# Patient Record
Sex: Female | Born: 1965 | Race: White | Hispanic: No | Marital: Married | Smoking: Never smoker
Health system: Southern US, Community
[De-identification: ages and names within clinical notes are randomized; demographics above are authoritative.]

## PROBLEM LIST (undated history)

## (undated) DIAGNOSIS — J45909 Unspecified asthma, uncomplicated: Secondary | ICD-10-CM

## (undated) HISTORY — DX: Unspecified asthma, uncomplicated: J45.909

## (undated) HISTORY — PX: HEMORRHOID SURGERY: SHX153

## (undated) HISTORY — PX: CHOLECYSTECTOMY: SHX55

---

## 1998-05-31 ENCOUNTER — Other Ambulatory Visit: Admission: RE | Admit: 1998-05-31 | Discharge: 1998-05-31 | Payer: Self-pay | Admitting: Obstetrics and Gynecology

## 1999-03-29 ENCOUNTER — Other Ambulatory Visit: Admission: RE | Admit: 1999-03-29 | Discharge: 1999-03-29 | Payer: Self-pay | Admitting: Obstetrics and Gynecology

## 1999-11-09 ENCOUNTER — Encounter: Payer: Self-pay | Admitting: Obstetrics and Gynecology

## 1999-11-09 ENCOUNTER — Ambulatory Visit (HOSPITAL_COMMUNITY): Admission: RE | Admit: 1999-11-09 | Discharge: 1999-11-09 | Payer: Self-pay | Admitting: Obstetrics and Gynecology

## 2000-04-15 ENCOUNTER — Other Ambulatory Visit: Admission: RE | Admit: 2000-04-15 | Discharge: 2000-04-15 | Payer: Self-pay | Admitting: Gynecology

## 2001-10-20 ENCOUNTER — Other Ambulatory Visit: Admission: RE | Admit: 2001-10-20 | Discharge: 2001-10-20 | Payer: Self-pay | Admitting: Obstetrics and Gynecology

## 2002-04-28 ENCOUNTER — Inpatient Hospital Stay (HOSPITAL_COMMUNITY): Admission: AD | Admit: 2002-04-28 | Discharge: 2002-05-01 | Payer: Self-pay | Admitting: Obstetrics and Gynecology

## 2002-05-02 ENCOUNTER — Encounter: Admission: RE | Admit: 2002-05-02 | Discharge: 2002-06-01 | Payer: Self-pay | Admitting: Obstetrics and Gynecology

## 2002-05-05 ENCOUNTER — Inpatient Hospital Stay (HOSPITAL_COMMUNITY): Admission: AD | Admit: 2002-05-05 | Discharge: 2002-05-05 | Payer: Self-pay | Admitting: Obstetrics and Gynecology

## 2002-05-05 ENCOUNTER — Encounter: Payer: Self-pay | Admitting: Obstetrics and Gynecology

## 2002-06-04 ENCOUNTER — Other Ambulatory Visit: Admission: RE | Admit: 2002-06-04 | Discharge: 2002-06-04 | Payer: Self-pay | Admitting: Obstetrics and Gynecology

## 2003-01-13 ENCOUNTER — Emergency Department (HOSPITAL_COMMUNITY): Admission: EM | Admit: 2003-01-13 | Discharge: 2003-01-14 | Payer: Self-pay | Admitting: *Deleted

## 2003-01-14 ENCOUNTER — Inpatient Hospital Stay (HOSPITAL_COMMUNITY): Admission: AD | Admit: 2003-01-14 | Discharge: 2003-01-17 | Payer: Self-pay | Admitting: General Surgery

## 2003-01-14 ENCOUNTER — Ambulatory Visit (HOSPITAL_COMMUNITY): Admission: RE | Admit: 2003-01-14 | Discharge: 2003-01-14 | Payer: Self-pay | Admitting: *Deleted

## 2003-01-16 ENCOUNTER — Encounter (INDEPENDENT_AMBULATORY_CARE_PROVIDER_SITE_OTHER): Payer: Self-pay | Admitting: *Deleted

## 2003-05-14 ENCOUNTER — Other Ambulatory Visit: Admission: RE | Admit: 2003-05-14 | Discharge: 2003-05-14 | Payer: Self-pay | Admitting: Gynecology

## 2004-05-16 ENCOUNTER — Other Ambulatory Visit: Admission: RE | Admit: 2004-05-16 | Discharge: 2004-05-16 | Payer: Self-pay | Admitting: Gynecology

## 2004-06-06 ENCOUNTER — Encounter: Admission: RE | Admit: 2004-06-06 | Discharge: 2004-06-06 | Payer: Self-pay | Admitting: Gynecology

## 2004-10-20 ENCOUNTER — Other Ambulatory Visit: Admission: RE | Admit: 2004-10-20 | Discharge: 2004-10-20 | Payer: Self-pay | Admitting: Obstetrics and Gynecology

## 2004-12-13 ENCOUNTER — Inpatient Hospital Stay (HOSPITAL_COMMUNITY): Admission: AD | Admit: 2004-12-13 | Discharge: 2004-12-14 | Payer: Self-pay | Admitting: Obstetrics and Gynecology

## 2005-03-28 ENCOUNTER — Encounter: Admission: RE | Admit: 2005-03-28 | Discharge: 2005-06-26 | Payer: Self-pay | Admitting: Obstetrics and Gynecology

## 2005-04-23 ENCOUNTER — Inpatient Hospital Stay (HOSPITAL_COMMUNITY): Admission: AD | Admit: 2005-04-23 | Discharge: 2005-04-26 | Payer: Self-pay | Admitting: Obstetrics and Gynecology

## 2005-04-23 ENCOUNTER — Encounter (INDEPENDENT_AMBULATORY_CARE_PROVIDER_SITE_OTHER): Payer: Self-pay | Admitting: Specialist

## 2005-06-04 ENCOUNTER — Other Ambulatory Visit: Admission: RE | Admit: 2005-06-04 | Discharge: 2005-06-04 | Payer: Self-pay | Admitting: Obstetrics and Gynecology

## 2006-04-18 ENCOUNTER — Encounter: Admission: RE | Admit: 2006-04-18 | Discharge: 2006-04-18 | Payer: Self-pay | Admitting: General Surgery

## 2006-04-18 ENCOUNTER — Encounter: Payer: Self-pay | Admitting: Critical Care Medicine

## 2006-07-15 ENCOUNTER — Ambulatory Visit: Payer: Self-pay | Admitting: Critical Care Medicine

## 2006-07-23 ENCOUNTER — Encounter: Payer: Self-pay | Admitting: Critical Care Medicine

## 2006-08-26 ENCOUNTER — Ambulatory Visit: Payer: Self-pay | Admitting: Critical Care Medicine

## 2007-04-14 ENCOUNTER — Encounter: Admission: RE | Admit: 2007-04-14 | Discharge: 2007-04-14 | Payer: Self-pay | Admitting: Obstetrics and Gynecology

## 2007-10-01 DIAGNOSIS — J45909 Unspecified asthma, uncomplicated: Secondary | ICD-10-CM | POA: Insufficient documentation

## 2007-10-02 ENCOUNTER — Ambulatory Visit: Payer: Self-pay | Admitting: Critical Care Medicine

## 2007-10-02 DIAGNOSIS — R0602 Shortness of breath: Secondary | ICD-10-CM | POA: Insufficient documentation

## 2007-10-15 ENCOUNTER — Ambulatory Visit (HOSPITAL_COMMUNITY): Admission: RE | Admit: 2007-10-15 | Discharge: 2007-10-15 | Payer: Self-pay | Admitting: Internal Medicine

## 2007-10-15 ENCOUNTER — Encounter: Payer: Self-pay | Admitting: Critical Care Medicine

## 2007-10-22 ENCOUNTER — Ambulatory Visit: Payer: Self-pay | Admitting: Critical Care Medicine

## 2007-10-23 ENCOUNTER — Encounter: Payer: Self-pay | Admitting: Critical Care Medicine

## 2007-11-03 ENCOUNTER — Telehealth: Payer: Self-pay | Admitting: Critical Care Medicine

## 2007-11-06 ENCOUNTER — Telehealth (INDEPENDENT_AMBULATORY_CARE_PROVIDER_SITE_OTHER): Payer: Self-pay | Admitting: *Deleted

## 2007-11-06 ENCOUNTER — Telehealth: Payer: Self-pay | Admitting: Critical Care Medicine

## 2007-11-10 ENCOUNTER — Ambulatory Visit: Payer: Self-pay | Admitting: Internal Medicine

## 2007-11-24 ENCOUNTER — Ambulatory Visit: Payer: Self-pay | Admitting: Critical Care Medicine

## 2008-01-19 ENCOUNTER — Telehealth: Payer: Self-pay | Admitting: Critical Care Medicine

## 2008-02-10 ENCOUNTER — Ambulatory Visit: Payer: Self-pay | Admitting: Internal Medicine

## 2008-04-06 ENCOUNTER — Ambulatory Visit: Payer: Self-pay | Admitting: Internal Medicine

## 2008-04-28 ENCOUNTER — Telehealth (INDEPENDENT_AMBULATORY_CARE_PROVIDER_SITE_OTHER): Payer: Self-pay | Admitting: *Deleted

## 2008-05-10 ENCOUNTER — Encounter: Admission: RE | Admit: 2008-05-10 | Discharge: 2008-05-10 | Payer: Self-pay | Admitting: Obstetrics and Gynecology

## 2008-05-20 ENCOUNTER — Ambulatory Visit: Payer: Self-pay | Admitting: Internal Medicine

## 2009-05-05 ENCOUNTER — Ambulatory Visit: Payer: Self-pay | Admitting: Vascular Surgery

## 2009-05-11 ENCOUNTER — Encounter: Admission: RE | Admit: 2009-05-11 | Discharge: 2009-05-11 | Payer: Self-pay | Admitting: Obstetrics and Gynecology

## 2009-08-02 IMAGING — MG MM SCREENING W/IMPLANTS
8 series · 8 of 8 positions shown · non-contrast
Comparison: Prior studies.

DG SCREENING W/IMPLANTS
Bilateral CC and MLO view(s) were taken.
Prior study comparison: April 14, 2007, bilateral DG screening w/implants.

DIGITAL SCREENING MAMMOGRAM W/IMPLANTS WITH CAD:

[R CC]
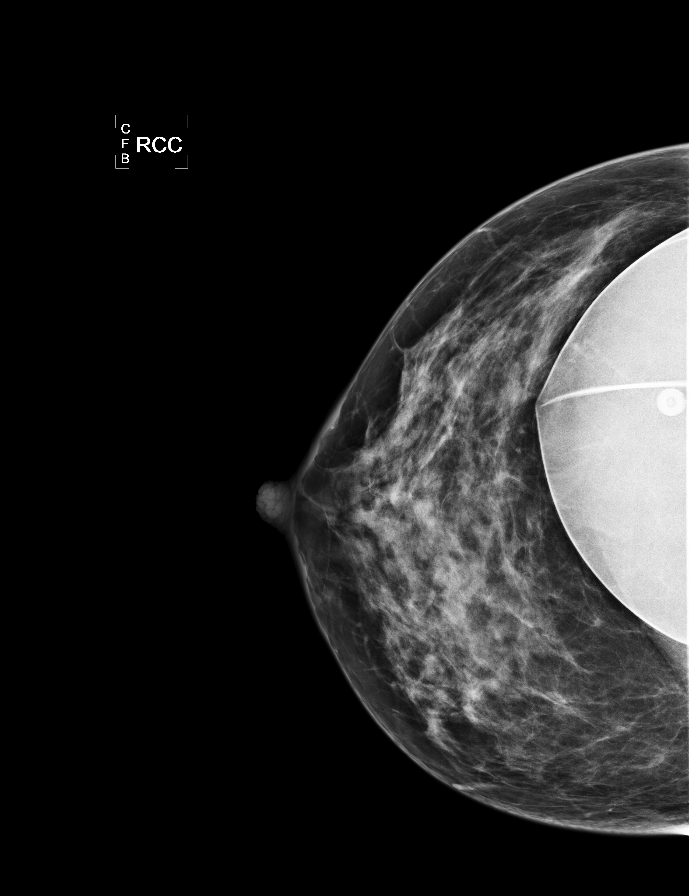

[L CC]
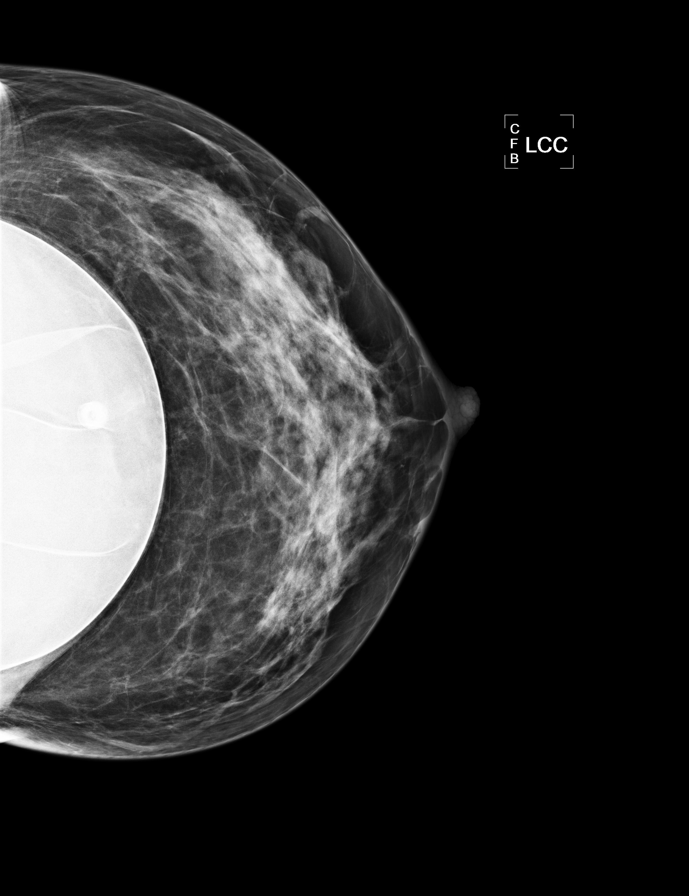

[L MLO]
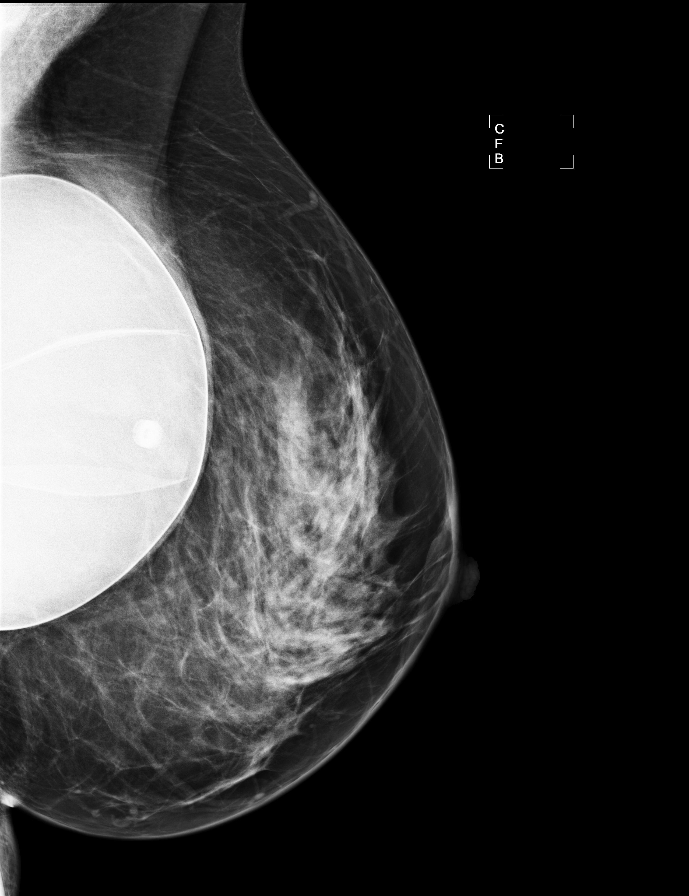

[R MLO]
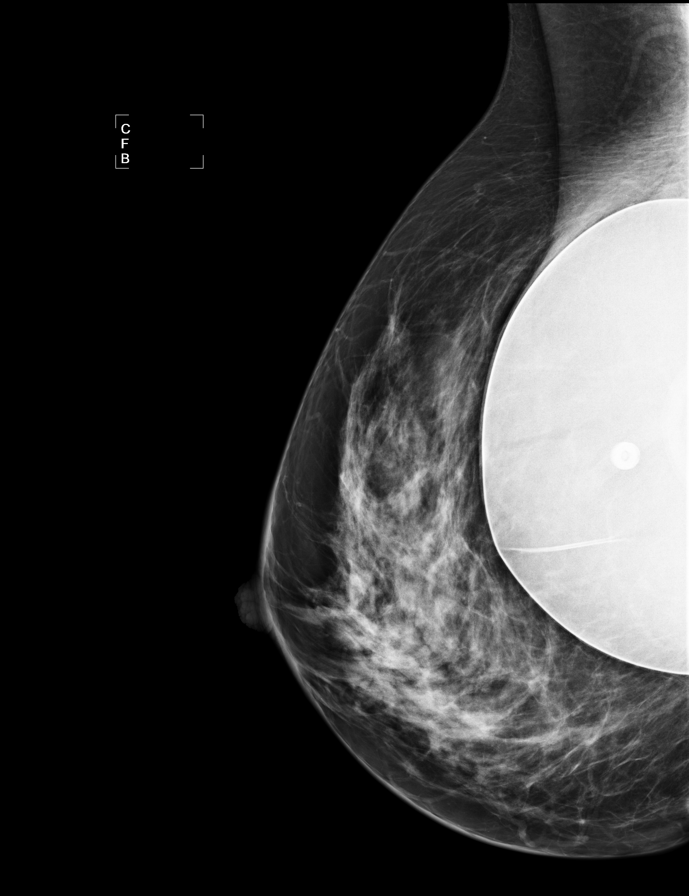

[R CCID]
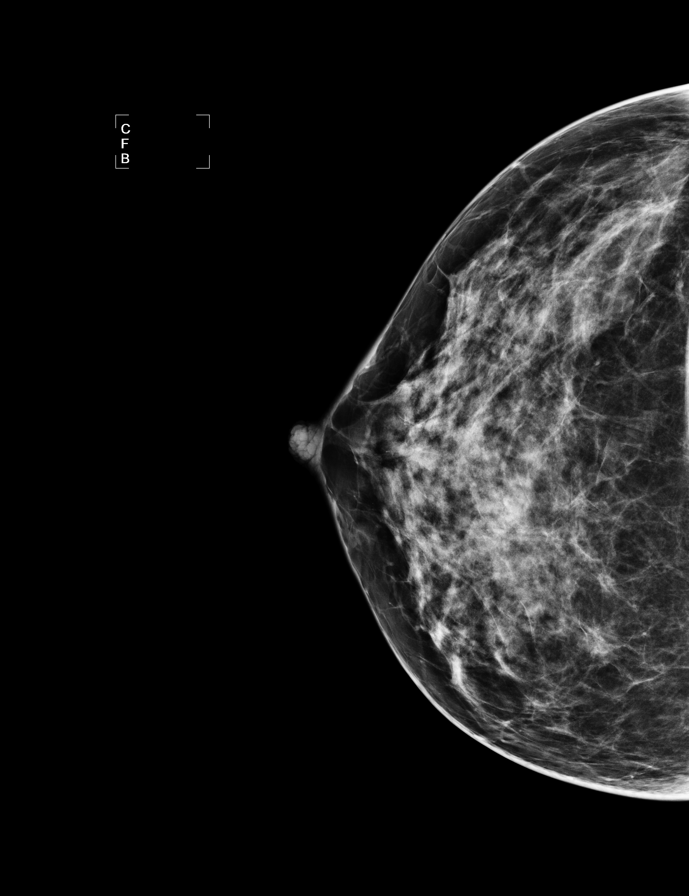

[L CCID]
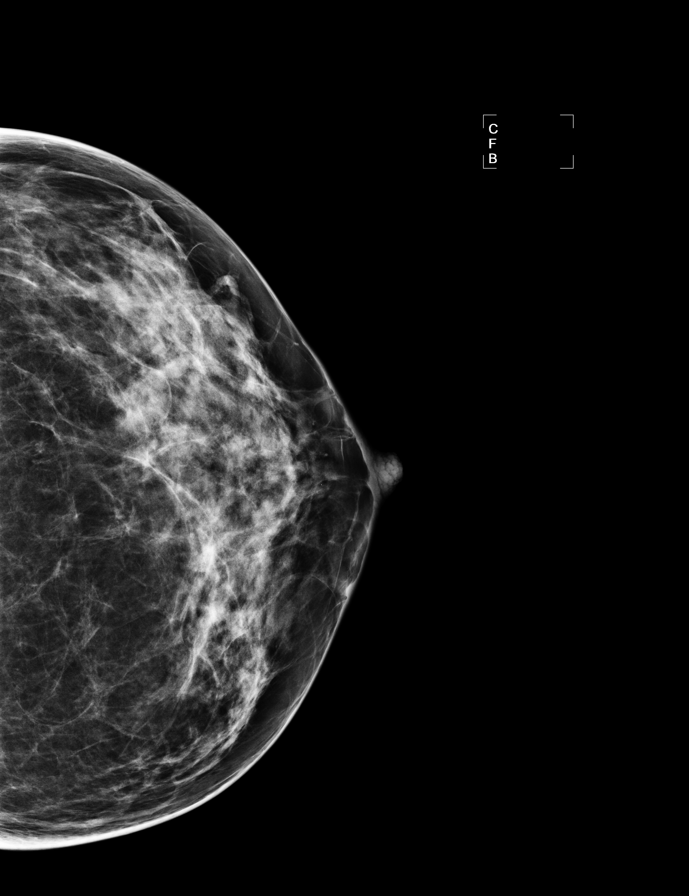

[L MLOID]
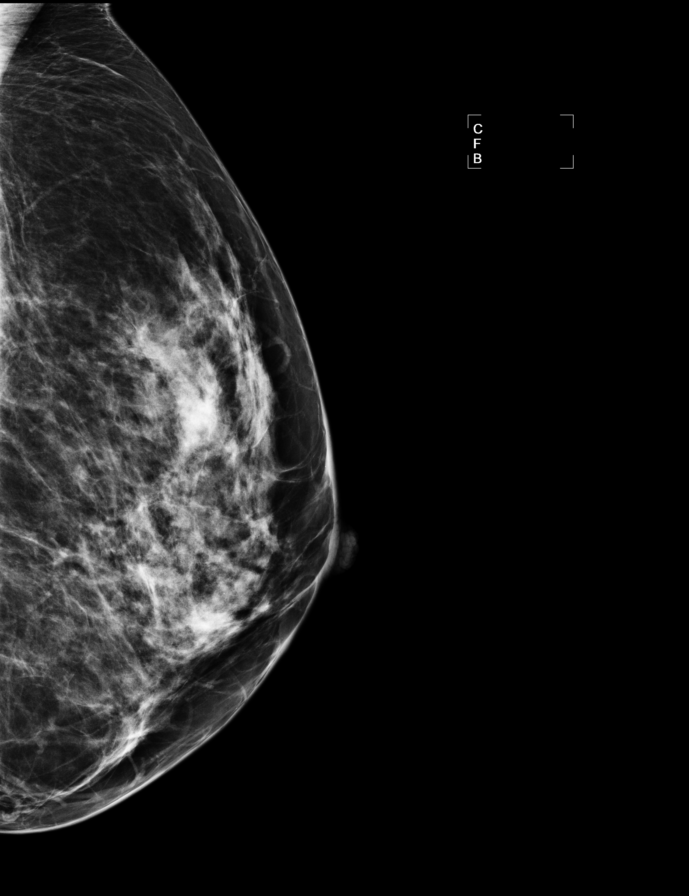

[R MLOID]
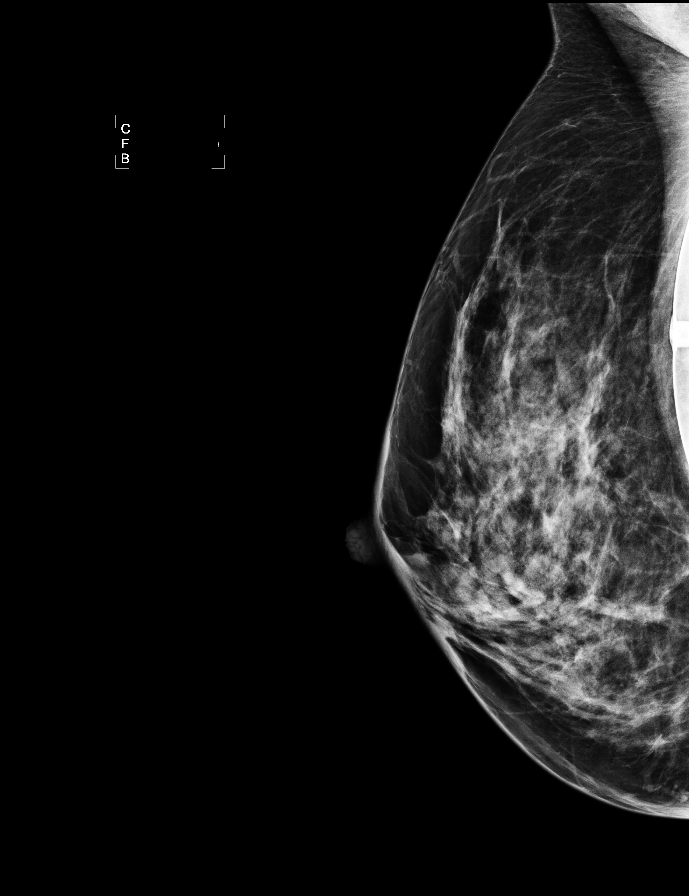

[8 of 8 positions shown; findings below may reference images not displayed]

There are subpectoral saline implants.  Implant included and implant displaced views are obtained.

The breast tissue is heterogeneously dense.  There is no dominant mass, architectural distortion or
calcification to suggest malignancy.
IMPRESSION: No mammographic evidence of malignancy.  Suggest yearly screening mammography.

A result letter of this screening mammogram will be mailed directly to the patient.

ASSESSMENT: Negative - BI-RADS 1

Screening mammogram of both breasts in 1 year.
ANALYZED BY COMPUTER AIDED DETECTION. , THIS PROCEDURE WAS A DIGITAL MAMMOGRAM.

## 2009-10-10 ENCOUNTER — Telehealth (INDEPENDENT_AMBULATORY_CARE_PROVIDER_SITE_OTHER): Payer: Self-pay | Admitting: *Deleted

## 2010-04-01 ENCOUNTER — Encounter: Payer: Self-pay | Admitting: *Deleted

## 2010-04-04 ENCOUNTER — Ambulatory Visit
Admission: RE | Admit: 2010-04-04 | Discharge: 2010-04-04 | Payer: Self-pay | Source: Home / Self Care | Attending: Critical Care Medicine | Admitting: Critical Care Medicine

## 2010-04-04 ENCOUNTER — Encounter: Payer: Self-pay | Admitting: Critical Care Medicine

## 2010-04-11 NOTE — Miscellaneous (Signed)
Summary: pft  Clinical Lists Changes  Observations: Added new observation of PFT COMMENTS: Completely normal PFT (10/23/2007 9:21) Added new observation of PFT RSLT: normal (10/23/2007 9:21) Added new observation of DLCO/VA%EXP: 100 % (10/23/2007 9:21) Added new observation of DLCO/VA: 4.23 % (10/23/2007 9:21) Added new observation of DLCO % EXPEC: 86 % (10/23/2007 9:21) Added new observation of DLCO: 17.0 % (10/23/2007 9:21) Added new observation of RV % EXPECT: 101 % (10/23/2007 9:21) Added new observation of RV: 1.52 L (10/23/2007 9:21) Added new observation of TLC % EXPECT: 118 % (10/23/2007 9:21) Added new observation of TLC: 5.47 L (10/23/2007 9:21) Added new observation of FEF2575%EXPS: 111 % (10/23/2007 9:21) Added new observation of PSTFEF25/75P: 3.07  (10/23/2007 9:21) Added new observation of PSTFEF25/75%: 3.41 L/min (10/23/2007 9:21) Added new observation of FEV1FVCPRDPS: 77 % (10/23/2007 9:21) Added new observation of PSTFEV1/FVC: 84 % (10/23/2007 9:21) Added new observation of POSTFEV1%PRD: 128 % (10/23/2007 9:21) Added new observation of FEV1PRDPST: 2.59 L (10/23/2007 9:21) Added new observation of POST FEV1: 3.30 L/min (10/23/2007 9:21) Added new observation of POST FVC%EXP: 118 % (10/23/2007 9:21) Added new observation of FVCPRDPST: 3.32 L/min (10/23/2007 9:21) Added new observation of POST FVC: 3.91 L (10/23/2007 9:21) Added new observation of FEF % EXPEC: 110 % (10/23/2007 9:21) Added new observation of FEF25-75%PRE: 3.07 L/min (10/23/2007 9:21) Added new observation of FEF 25-75%: 3.39 L/min (10/23/2007 9:21) Added new observation of FEV1/FVC PRE: 77 % (10/23/2007 9:21) Added new observation of FEV1/FVC: 82 % (10/23/2007 9:21) Added new observation of FEV1 % EXP: 125 % (10/23/2007 9:21) Added new observation of FEV1 PREDICT: 2.59 L (10/23/2007 9:21) Added new observation of FEV1: 3.24 L (10/23/2007 9:21) Added new observation of FVC % EXPECT: 119 % (10/23/2007  9:21) Added new observation of FVC PREDICT: 3.32 L (10/23/2007 9:21) Added new observation of FVC: 3.94 L (10/23/2007 9:21) Added new observation of HEIGHT: 62 in (10/23/2007 9:21) Added new observation of PFT DATE: 10/22/2007  (10/23/2007 9:21)  Pulmonary Function Test Date: 10/22/2007 Height (in.): 62 Gender: Female  Pre-Spirometry FVC    Value: 3.94 L/min   Pred: 3.32 L/min     % Pred: 119 % FEV1    Value: 3.24 L     Pred: 2.59 L     % Pred: 125 % FEV1/FVC  Value: 82 %     Pred: 77 %    FEF 25-75  Value: 3.39 L/min   Pred: 3.07 L/min     % Pred: 110 %  Post-Spirometry FVC    Value: 3.91 L/min   Pred: 3.32 L/min     % Pred: 118 % FEV1    Value: 3.30 L     Pred: 2.59 L     % Pred: 128 % FEV1/FVC  Value: 84 %     Pred: 77 %    FEF 25-75  Value: 3.41 L/min   Pred: 3.07 L/min     % Pred: 111 %  Lung Volumes TLC    Value: 5.47 L   % Pred: 118 % RV    Value: 1.52 L   % Pred: 101 % DLCO    Value: 17.0 %   % Pred: 86 % DLCO/VA  Value: 4.23 %   % Pred: 100 %  Comments: Completely normal PFT  Evaluation: normal Michel Bickers [facility]  October 23, 2007 9:31 AM   Appended Document: pft result noted  patient aware

## 2010-04-11 NOTE — Letter (Signed)
Summary: CPST [facility]  [address]   [place], [place] [address]   Phone: [phone]  Fax: [phone]     Patient's Name: [name] Date of Birth: [birth year] MRN: [id number]  CPST  Choose test method and choice  a)___Bike - recommended by ATS/ACCP. Do at [facility] at Dr[facility] G[facility]  b)___Treadmill - less preferred. Do at [facility] at Dr. Gala Romney lab or do at [facility] PFT lab  Choose one or more indication for test  INDICATIONS FOR CARDIOPULMONARY EXERCISE TESTING Evaluation of exercise tolerance ______ Determination of functional impairment or capacity (peak V? O2) ______ Determination of exercise-limiting factors and pathophysiologic mechanisms  Evaluation of undiagnosed exercise intolerance _____ Assessing contribution of cardiac and pulmonary etiology in coexisting disease _____ Symptoms disproportionate to resting pulmonary and cardiac tests  _____Unexplained dyspnea when initial cardiopulmonary testing is nondiagnostic  Evaluation of patients with cardiovascular disease _____ Functional evaluation and prognosis in patients with heart failure _____ Selection for cardiac transplantation _____ Exercise prescription and monitoring response to exercise training for cardiac rehabilitation (special circumstances; i.e., pacemakers)  Evaluation of patients with respiratory disease _____ Functional impairment assessment (see specific clinical applications)  _____Chronic obstructive pulmonary disease Establishing exercise limitation(s) and assessing other potential contributing factors, especially occult heart disease (ischemia) ______Determination of magnitude of hypoxemia and for O2 prescription When objective determination of therapeutic intervention is necessary and not adequately addressed by standard pulmonary function testing  _____ Interstitial lung diseases _____Detection of early (occult) gas exchange abnormalities _____Overall  assessment/monitoring of pulmonary gas exchange _____Determination of magnitude of hypoxemia and for O2 prescription _____Determination of potential exercise-limiting factors _____Documentation of therapeutic response to potentially toxic therapy  ____ Pulmonary vascular disease (careful risk-benefit analysis required)  ____ Cystic fibrosis  ____ Exercise-induced bronchospasm  Specific clinical applications ____  Preoperative evaluation _____Lung resectional surgery _____Elderly patients undergoing major abdominal surgery _____Lung volume resectional surgery for emphysema (currently investigational)  ____ Exercise evaluation and prescription for pulmonary rehabilitation  ____ Evaluation for impairment-disability  ____ Evaluation for lung, heart-lung transplantation ____ Definition of abbreviation: V? O2______ -oxygen consumption.    Alfonso Ramus    [facility]

## 2010-04-11 NOTE — Assessment & Plan Note (Signed)
Summary: Pulmonary OV   PCP:  Grewal  Chief Complaint:  Follow-up CPST and PFT.  Pharmacy wanting to change pt's Allegra D 12H to Allegtra 180mg . See form.Marland Kitchen  History of Present Illness: Pulmonary OV F/U:  [name] is a 45 year old female with history of moderate persistent asthma, EIA and atopic features. Pos Cpst 7/09 for EIA  7/23:   Since the last office visit the patient has stopped taking Qvar.  She is not using her rescue inhaler regularly.  Her cough is now resolved.  She is still short of breath with exertion.  Some days are worse than others.  She is able to take care of her small children.  But occasionally when she sits at a desk,  she becomes short of breath.  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  9/14:  CPST performed:  positive for airflow obstruction with exercise.  Pt now on qvar.  Notes less dyspnea.  There is no cough.  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  Will provide MSD sheets.  9/14: ? sl better.   on qvar.  runs with 44yo.  still sob even if not physically active. no cough.  not use of ventolin hfa.   no cp.   no wheeze.   no mucous.   had an infection and took voice away three weeks ago.  no wheeze. never had wheeze.    no nocturnal sxs.      Prior Medications Reviewed Using: Patient Recall  Prior Medication List:  QVAR 40 MCG/ACT  AERS (BECLOMETHASONE DIPROPIONATE) Inhale 2 puffs two times a day VENTOLIN HFA 108 (90 BASE) MCG/ACT  AERS (ALBUTEROL SULFATE) prn ALLEGRA-D 12 HOUR 60-120 MG XR12H-TAB (FEXOFENADINE-PSEUDOEPHEDRINE) Take 1 tablet by mouth two times a day as needed   Current Allergies (reviewed today): ! SULFA  Past Medical History:    Reviewed history from 10/02/2007 and no changes required:       Asthma  6/08: normal spirometry; positive CPST for EIA 09/2007     Review of Systems      See HPI   Vital Signs:  Patient  Profile:   45 Years Old Female Height:     62 inches Weight:      122 pounds O2 Sat:      98 % O2 treatment:    Room Air Temp:     98.1 degrees F oral Pulse rate:   69 / minute BP sitting:   106 / 68  (left arm) Cuff size:   regular  Vitals Entered By: Michel Bickers CMA (November 24, 2007 12:10 PM)                 Physical Exam  Gen: WD WN     WF     in NAD    NCAT Heent:  no jvd, no TMG, no cervical LNademopathy, orophyx clear,  nares with clear watery drainage. Cor: RRR nl s1/s2  no s3/s4  no m r h g Abd: soft NT BSA   no masses  No HSM  no rebound or guarding Ext perfused with no c v e v.d Neuro: intact, moves all 4s, CN II-XII intact, DTRs intact Chest:   no wheezes, rales, rhonchi   no egophony  no consolidative breath sounds Skin: clear  Genital/Rectal :deferred       Impression & Recommendations:  Problem #  1:  ASTHMA (ICD-493.90) Assessment: Unchanged EIA with pos CPST; atopic features plan: qvar two times a day dosing, adjust to three puff two times a day for one week then two puff two times a day  40mg  stn. allergy eval   Medications Added to Medication List This Visit: 1)  Allegra 180 Mg Tabs (Fexofenadine hcl) .... By mouth daily  Complete Medication List: 1)  Qvar 40 Mcg/act Aers (Beclomethasone dipropionate) .... Inhale 2 puffs two times a day 2)  Ventolin Hfa 108 (90 Base) Mcg/act Aers (Albuterol sulfate) .... Prn 3)  Allegra 180 Mg Tabs (Fexofenadine hcl) .... By mouth daily   Patient Instructions: 1)  Allergy  evaluation with dr Maple Hudson 2)  Return in two months 3)  increase Qvar to three puffs twice a day for one week then reduce to two puffs twice a day   Prescriptions: ALLEGRA 180 MG  TABS (FEXOFENADINE HCL) By mouth daily  #30 x 6   Entered and Authorized by:   Bethany Frisk MD   Signed by:   Bethany Frisk MD on 11/24/2007   Method used:   Electronically to        [facility]* (retail)       [address]        [place], [place]  [address]       Ph: [phone]       Fax: [phone]   RxID:   [id number]  ]

## 2010-04-11 NOTE — Assessment & Plan Note (Signed)
Summary: six min walk  Nurse Visit   Vital Signs:  Patient Profile:   45 Years Old Female Height:     62 inches Pulse rate:   78 / minute BP sitting:   110 / 68                 Prior Medications: QVAR 40 MCG/ACT  AERS (BECLOMETHASONE DIPROPIONATE) Inhale 2 puffs two times a day VENTOLIN HFA 108 (90 BASE) MCG/ACT  AERS (ALBUTEROL SULFATE) prn ALLEGRA 180 MG  TABS (FEXOFENADINE HCL) By mouth daily Current Allergies: ! SULFA    Orders Added: 1)  Pulmonary Stress (6 min walk) [place]    ]  Six Minute Walk Test Medications taken before test(dose and time): none Supplemental oxygen during the test: No  Lap counter(place a tick mark inside a square for each lap completed) lap 1 complete  lap 2 complete   lap 3 complete   lap 4 complete  lap 5 complete  lap 6 complete  lap 7 complete   lap 8 complete   lap 9 complete  lap 10 complete  lap 11 complete    Baseline  BP sitting: 110/ 68 Heart rate: 78 Dyspnea ( Borg scale) 0 Fatigue (Borg scale) 1 SPO2 98  End Of Test  BP sitting: 112/ 70 Heart rate: 87 Dyspnea ( Borg scale) 2 Fatigue (Borg scale) 1 SPO2 100  2 Minutes post  BP sitting: 110/ 70 Heart rate: 78 SPO2 100  Stopped or paused before six minutes? No  Interpretation: Number of laps  11 X 48 meters =   528 meters+ final partial lap: 27 meters =    555 meters   Total distance walked in six minutes: 555 meters  Tech ID: Tivis Ringer (April 06, 2008 4:11 PM) Tech Comments pt completed test w/ 0 rest breaks and 0 complaints.

## 2010-04-11 NOTE — Assessment & Plan Note (Signed)
Summary: ALLERGY CONSULT PER PW///kp   Vital Signs:  Patient Profile:   45 Years Old Female Height:     62 inches Weight:      129 pounds O2 Sat:      100 % O2 treatment:    Room Air Pulse rate:   102 / minute BP sitting:   110 / 76  (left arm) Cuff size:   regular  Vitals Entered By: Reynaldo Minium CMA (February 10, 2008 4:23 PM)                 Referred by:  Dr. Delford Field PCP:  Vincente Poli  Chief Complaint:  allergy  consult.  History of Present Illness: 45 yoF referred in allergy consultation by Dr P.Wright, for evaluation of atopic aspects of asthma. She describes moderate persistent asthma x 2 years, with cough intermittently free up to 3-4 months at a time. Little rhinitis, itch or sneeze. Feels short of breath, and bothersome compulsion to yawn to relieve dyspnea, but little wheeze.  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  RAST 07/23/06- Neg, IgE 23.7. IgG food panel was broadly positive but she denies hx of food sensitivity. PFT 07/15/06- WNL. CPST ?'d mild exertional asthma. 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 She gets visible dust on her desk, but it doesn't limit activity and she does not feel worse at work or on work days.      Updated Prior Medication List: QVAR 40 MCG/ACT  AERS (BECLOMETHASONE DIPROPIONATE) Inhale 2 puffs two times a day VENTOLIN HFA 108 (90 BASE) MCG/ACT  AERS (ALBUTEROL SULFATE) prn ALLEGRA 180 MG  TABS (FEXOFENADINE HCL) By mouth daily  Current Allergies (reviewed today): ! SULFA  Past Medical History:    Reviewed history from 11/24/2007 and no changes required:       Asthma  6/08: normal spirometry; positive CPST for EIA 09/2007  Past Surgical History:    Reviewed history from  10/01/2007 and no changes required:       Gallbladder--2004       C-section--2004 & 2007   Family History:    Reviewed history from 10/01/2007 and no changes required:       Family History Asthma--mother  Social History:    Reviewed history from 10/01/2007 and no changes required:       Patient never smoked.        No regular exercise       Works Presenter, broadcasting.    Review of Systems      See HPI       Denies headache, sinus drainage, sneezing chest pain, n/v/d, weight loss, fever, edema.     Physical Exam  General: A/Ox3; pleasant and cooperative, NAD, SKIN: no rash, lesions NODES: no lymphadenopathy HEENT: [place]/AT, EOM- WNL, Conjuctivae- clear, PERRLA, TM-WNL, Nose- clear, Throat- clear and wnl NECK: Supple w/ fair ROM, JVD- none, normal carotid impulses w/o bruits Thyroid- normal to palpation CHEST: Clear to P&A, no wheeze, cough, dullness or accessory muscle use. HEART: RRR, no m/g/r heard ABDOMEN: Soft and nl; nml bowel sounds; no organomegaly or masses noted ZOX:WRUE, nl pulses, no edema  NEURO: Grossly intact to observation       Impression & Recommendations:  Problem # 1:  DYSPNEA (ICD-786.05) Not clear that this is an obstructive airway process. Will order more detailed PFT, including Methacholine inhalation challenge as discussed. Not clear the validity or significance of elevated food related IgGs in the absence of any relation to exposure. She will watch this issuw. We discussed options and she will return off antihistamines for skin testing, including a limited food panel, looking for tissue- fixed IgE response.   Patient Instructions: 1)  Please schedule a follow up appt for allergy skin testing 2)  - you will need to be off allergy pills, antihistamines, cold and flu meds, over the counter sleeping pills- for 3 days before skin testing 3)  Schedule PFT and methacholine challenge test with [facility]   Prescriptions: QVAR 40 MCG/ACT  AERS (BECLOMETHASONE  DIPROPIONATE) Inhale 2 puffs two times a day  #1 x 5   Entered and Authorized by:   Waymon Budge MD   Signed by:   Waymon Budge MD on 02/10/2008   Method used:   Print then Give to Patient   RxID:   [id number]  ]

## 2010-04-11 NOTE — Progress Notes (Signed)
Summary: INHALER  Phone Note Call from Patient Call back at [phone]   Caller: Patient Call For: [name] Summary of Call: WANT TO SPEAK TO NURSE ABOUT Q-VAR INHALER Initial call taken by: Rickard Patience,  January 19, 2008 10:28 AM  Follow-up for Phone Call        Called spoke with pt.  Pt states she is having a hard time breathing this am.  Onset of worsened breathing last pm.  Pt has been on Qvar, pt wants to know if you would consider changing her maintaince inhaler.  Feels breathing is not adequately controlled on Qvar.  Using incr amounts of relief inhaler continuously.  Advised PW out of office, offered appt with another MD to evaluate.  Pt declined states she will call back if breathing worsened for OV before PW returns. Follow-up by: Cloyde Reams RN,  January 19, 2008 10:46 AM  Additional Follow-up for Phone Call Additional follow up Details #1::        she needs to increase qvar to 4 puffs twice a day for one week then reduce to three puff twice a day. as well obtain and instruct for the pt an aerochamber to use with the qvar inhaler Additional Follow-up by: Storm Frisk MD,  January 19, 2008 1:18 PM    Additional Follow-up for Phone Call Additional follow up Details #2::    Called spoke with pt advised of PW's recommendation to incr Qvar to 4 puffs two times a day x 1 week then decr to 3 puffs two times a day and see if this helps better control breathing and reduces need for relief inhaler.  Advised pt of recommendation to use spacer with Qvar.  Pt's husband is a Teacher, early years/pre.  She will obtain from him.  If needs [place] to get or instruct pt will call back. Follow-up by: Cloyde Reams RN,  January 19, 2008 2:22 PM

## 2010-04-11 NOTE — Assessment & Plan Note (Signed)
Summary: Pulmonary OV   PCP:  Grewal  Chief Complaint:  Follow-up.  Pt states she has been doing well and isn't using her inhalers much at all.Marland Kitchen  History of Present Illness: Pulmonary OV F/U:  This patient has not been seen since 08/2006.  At last OV following was Hx documented:  [name] is a 45 year old female with history of moderate persistent asthma, doing better with decreased cough, decreased shortness of breath. Maintains on Qvar 40 mcg strength 2 sprays b.i.d., albuterol p.r.n.  She has not required her rescue inhaler.  She brought to my attention a CT of the chest that was done to evaluate her xiphoid process in February of 2008, it showed subpleural nodules in the left lower lobe.  She is a non smoker and on review of the nodules they are not significant.  She is not currently having any active respiratory complaints at this time.   7/23:   Since the last office visit the patient has stopped taking Qvar.  She is not using her rescue inhaler regularly.  Her cough is now resolved.  She is still short of breath with exertion.  Some days are worse than others.  She is able to take care of her small children.  But occasionally when she sits at a desk,  she becomes short of breath.  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  The patient denies any nocturnal shortness of breath, wheezing, or chest tightness.  The patient does not have a peak flow meter.  Often when she uses her rescue albuterol inhaler, it does or does not help the shortness of breath.      Prior Medications Reviewed Using: Patient Recall  Updated Prior Medication List: QVAR 40 MCG/ACT  AERS (BECLOMETHASONE DIPROPIONATE) Pt not using VENTOLIN HFA 108 (90 BASE) MCG/ACT  AERS (ALBUTEROL SULFATE) prn  Current Allergies (reviewed today): ! SULFA  Past Medical History:    Reviewed history from 10/01/2007 and no changes required:  Asthma  6/08: normal spirometry   Family History:    Reviewed history from 10/01/2007 and no changes required:       Family History Asthma--mother  Social History:    Reviewed history from 10/01/2007 and no changes required:       Patient never smoked.     Review of Systems      See HPI   Vital Signs:  Patient Profile:   45 Years Old Female Weight:      119 pounds O2 Sat:      97 % O2 treatment:    Room Air Temp:     98.0 degrees F oral Pulse rate:   85 / minute BP sitting:   102 / 74  (left arm) Cuff size:   regular  Vitals Entered By: Michel Bickers CMA (October 02, 2007 10:59 AM)                 Physical Exam  Gen: WD WN     WF     in NAD    NCAT Heent:  no jvd, no TMG, no cervical LNademopathy, orophyx clear,  nares with clear watery drainage. Cor: RRR nl s1/s2  no s3/s4  no m r h g Abd: soft NT BSA   no masses  No HSM  no rebound or guarding Ext perfused with no c v e v.d Neuro: intact, moves all 4s, CN II-XII intact, DTRs  intact Chest:   no wheezes, rales, rhonchi   no egophony  no consolidative breath sounds Skin: clear  Genital/Rectal :deferred       Impression & Recommendations:  Problem # 1:  DYSPNEA (ICD-786.05) Assessment: Unchanged catheter dyspnea of unclear etiology.  The absence of abnormal sclerotic tree does not rule out asthma.  The patient largely had a post viral bronchitis and bronchiolitis in 2008.  The cough now is resolved.  The patient's shortness of breath.  Does not appear to be completely physiologic.  There may be an anxiety overlay.  Plan obtain full cardiopulmonary stress testing with Bruce protocol. Also, obtain full set of pulmonary function testing.  But we will also obtain pre-and post exercise spirometry.   Once the results of the above are available.  Further recommendations will follow.  In the interim, will on scheduled inhaled corticosteroids.Pt to return when testing completed. Orders: Est. Patient Level IV (09811)  Pulmonary Referral (Pulmonary)   Medications Added to Medication List This Visit: 1)  Qvar 40 Mcg/act Aers (Beclomethasone dipropionate) .... Pt not using 2)  Ventolin Hfa 108 (90 Base) Mcg/act Aers (Albuterol sulfate) .... Prn  Complete Medication List: 1)  Qvar 40 Mcg/act Aers (Beclomethasone dipropionate) .... Pt not using 2)  Ventolin Hfa 108 (90 Base) Mcg/act Aers (Albuterol sulfate) .... Prn   Patient Instructions: 1)  Need to obtain full pulmonary function studies and cardiopulmonary stress test 2)  return after these studies completed 3)  stay off Qvar for now   ]

## 2010-04-11 NOTE — Progress Notes (Signed)
Summary: records  Phone Note From Other Clinic Call back at [phone] Pat   Caller: [facility] Call For: Young Summary of Call: please send last office notes and PFT results from 2009.  Need today for ov tomorrow.  Pt being schedulled for surgery.  fax [phone] Initial call taken by: Eugene Gavia,  October 10, 2009 9:52 AM  Follow-up for Phone Call        Faxed records.//Juanita Follow-up by: Darletta Moll,  October 10, 2009 11:51 AM

## 2010-04-11 NOTE — Letter (Signed)
Summary: CPST- R/O Contraindications  [facility]  [address]   [place], [place] [address]   Phone: [phone]  Fax: [phone]    Patient's Name: LIBERTIE HAUSLER Date of Birth: 1966-03-09 MRN: [id number]  *********Rule out Contraindications**************** Absolute                                                                                                                           ___ Acute MI (3-5 Days)                                 ___ Unstable Angina                                          ___ Uncontrolled arrhythmias causing symptoms or hemodynamic compromise. ___ Syncope                                                     ___ Active endocarditis                                         ___ Acute Myocarditis/Pericarditis                        ___ Symptomatic severe aortic stenosis  ___ Acute Pulmonary embolus or pulmonary infarction                ___ Uncontrolled Heart Failure  ___ Thrombosis of lower extremitie ___ Suspected dissecting aneurysm ___ Uncontrolled Asthma                          ___ Pulmonary Edema                                        ___ RA desat @ rest<85%                                      ___ Repiratory Failure                                         ___ Acute noncardiopulmonary disorder that may affect exercise performance or be         aggravated by exercise (ie infection, renal failure,  thyrotoxicosis) .                               ___ Mental impairment leading to inabliity to cooperate   Relative ___ Left main coronary stenosis or its equivalent ___ Moderate stentoic valvular heart disease ___ Severe untreated arterial hypertension @ rest (<200 mmHg             systolic,>136mmHg Diastolic ___ Tachy/Brady Arrhythmias ___ High- degree artioventricular block ___ Hypertrophic cardiomyopathy ___ Significant pulmonary hypertension ___ Advanced or complicated pregnancy ___ Electrolyte abnormalities ___ Orthopedic impairment  that compromises exercise performance    [facility]    [facility]

## 2010-04-11 NOTE — Progress Notes (Signed)
Summary: TEST  Phone Note Call from Patient Call back at [phone]   Caller: Patient Call For: YOUNG Summary of Call: NEED TO CANCEL APPT FOR [facility] TEST FOR 2/23 /2010 AT [facility] Initial call taken by: Rickard Patience,  April 28, 2008 11:44 AM  Follow-up for Phone Call        Pcc, can you  help with this? Thanks. Reynaldo Minium CMA  April 28, 2008 11:52 AM   Additional Follow-up for Phone Call Additional follow up Details #1::        called cardio [email or web address]  to cancel methacholine challenge for 05/04/08 pt's instruction,she will call back later to reschedule  Additional Follow-up by: Oneita Jolly,  April 28, 2008 4:27 PM

## 2010-04-11 NOTE — Miscellaneous (Signed)
Summary: Orders Update pft charges  Clinical Lists Changes  Orders: Added new Service order of Carbon Monoxide diffusing w/capacity (94720) - Signed Added new Service order of Lung Volumes (94240) - Signed Added new Service order of Spirometry (Pre & Post) (94060) - Signed 

## 2010-04-11 NOTE — Progress Notes (Signed)
Summary: rx req  Phone Note Call from Patient   Caller: Patient Call For: wright Summary of Call: pt has made a f/u appt to see dr Delford Field, but is requesting refill of QVAR and wants a new rx for allegra D (which was discussed w/ pw at last visit). [facility] (new pharmacy- just opened) [phone]. Initial call taken by: Tivis Ringer,  November 06, 2007 1:40 PM

## 2010-04-13 NOTE — Assessment & Plan Note (Signed)
Summary: Pulmonary OV   Copy to:  Dr. Delford Field Primary Provider/Referring Provider:  Vincente Poli  CC:  Follow up for rxs and recheck.  Last seen 11/2007.  Pt states she may go several months with no SOB but then may have a few days she needs to rely on resue HFA.  Denies wheezing, chest tightness, and cough..  History of Present Illness: Pulmonary OV F/U:   02/10/08- 41 yoF referred in allergy consultation by Dr P.Kaian Fahs, for evaluation of atopic aspects of asthma. She describes moderate persistent asthma x 2 years, with cough intermittently free up to 3-4 months at a time. Little rhinitis, itch or sneeze. Feels short of breath, and bothersome compulsion to yawn to relieve dyspnea, but little wheeze.  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  RAST 07/23/06- Neg, IgE 23.7. IgG food panel was broadly positive but she denies hx of food sensitivity. PFT 07/15/06- WNL. CPST ?'d mild exertional asthma. 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 She gets visible dust on her desk, but it doesn't limit activity and she does not feel worse at work or on work days.  05/20/08- asthma Here for allergy testing. Has noted increased tightness in past week but not to levels requiring inhaler. No acute problem since last here.  Skin test- Significant positives for weeds, trees, dust/mite.      Pulmonary OV F/U:  Ms. Wilner is a 46 year old female with history of moderate persistent asthma, EIA and atopic features. Pos Cpst 7/09 for EIA     April 04, 2010 11:39 AM Pt notes since last ov, still uses albuterol as needed,   The pt will get short of breath at times, will have to yawn to get air,  no chest tightness, no wheeze,  will feel short ness of  breath, 75% of time is relieved with albuterol use . THe pt still works at [employer],  She had a cpst study in 09 that showed EIA.  She never had a methacholine challenge study. She is still on as needed SABAs.   She is now off ICS Allergy eval showed significant atopy  Asthma History    Initial Asthma Severity Rating:    Age range: 12+ years    Symptoms: 0-2 days/week    Nighttime Awakenings: 0-2/month    Interferes w/ normal activity: minor limitations    SABA use (not for EIB): 0-2 days/week    Exacerbations requiring oral systemic steroids: 0-1/year    Asthma Severity Assessment: Mild Persistent   Current Medications (verified): 1)  Ventolin Hfa 108 (90 Base) Mcg/act  Aers (Albuterol Sulfate) .... Inhale 2 Puffs Every 4 Hours As Needed 2)  Zyrtec Allergy 10 Mg Tabs (Cetirizine Hcl) .... Take 1 Tablet By Mouth Once A Day As Needed 3)  Multivitamins  Tabs (Multiple Vitamin) .... Take 1 Tablet By Mouth Once A Day  Allergies (verified): 1)  ! Sulfa  Past History:  Past medical, surgical, family and social histories (including risk factors) reviewed, and no changes noted (except as noted below).  Past Medical History: Reviewed history from 05/20/2008 and no changes required. Asthma  6/08: normal spirometry; positive CPST for EIA 09/2007  3/10  Positive allergy skin tests  Past Surgical History: Reviewed history from 10/01/2007 and no changes required. Gallbladder--2004 C-section--2004 & 2007  Family History: Reviewed history from 10/01/2007 and no changes required. Family History Asthma--mother  Social History: Reviewed history from 02/10/2008 and no changes required. Patient never smoked.  No regular exercise Works Presenter, broadcasting.  Review of Systems       The patient complains of shortness of breath with activity.  The patient denies shortness of breath at rest, productive cough, non-productive cough, coughing up blood, chest pain, irregular  heartbeats, acid heartburn, indigestion, loss of appetite, weight change, abdominal pain, difficulty swallowing, sore throat, tooth/dental problems, headaches, nasal congestion/difficulty breathing through nose, sneezing, itching, ear ache, anxiety, depression, hand/feet swelling, joint stiffness or pain, rash, change in color of mucus, and fever.    Vital Signs:  Patient profile:   45 year old female Height:      62 inches Weight:      128.25 pounds BMI:     23.54 O2 Sat:      96 % on Room air Temp:     98.3 degrees F oral Pulse rate:   66 / minute BP sitting:   112 / 68  (left arm) Cuff size:   regular  Vitals Entered By: Gweneth Dimitri RN (April 04, 2010 11:26 AM)  O2 Flow:  Room air CC: Follow up for rxs and recheck.  Last seen 11/2007.  Pt states she may go several months with no SOB but then may have a few days she needs to rely on resue HFA.  Denies wheezing, chest tightness, cough. Comments Medications reviewed with patient Daytime contact number verified with patient. Crystal Jones RN  April 04, 2010 11:28 AM    Physical Exam  Additional Exam:  General: A/Ox3; pleasant and cooperative, NAD, SKIN: no rash, lesions NODES: no lymphadenopathy HEENT: [place]/AT, EOM- WNL, Conjuctivae- clear, PERRLA, TM-WNL, Nose- clear, Throat- clear and wnl  CHEST: Clear to P&A, no wheeze, cough, dullness or accessory muscle use. HEART: RRR, no m/g/r heard    Impression & Recommendations:  Problem # 1:  ASTHMA (ICD-493.90) Assessment Unchanged Mild intermitent asthma due to atopy and EIA Note spirometry is normal today plan cont with as needed saba for now no need to be on ics   Medications Added to Medication List This Visit: 1)  Ventolin Hfa 108 (90 Base) Mcg/act Aers (Albuterol sulfate) .... Inhale 2 puffs every 4 hours as needed 2)  Zyrtec Allergy 10 Mg Tabs (Cetirizine hcl) .... Take 1 tablet by mouth once a day as needed 3)  Multivitamins Tabs (Multiple vitamin) .... Take 1  tablet by mouth once a day  Complete Medication List: 1)  Ventolin Hfa 108 (90 Base) Mcg/act Aers (Albuterol sulfate) .... Inhale 2 puffs every 4 hours as needed 2)  Zyrtec Allergy 10 Mg Tabs (Cetirizine hcl) .... Take 1 tablet by mouth once a day as needed 3)  Multivitamins Tabs (Multiple vitamin) .... Take 1 tablet by mouth once a day  Other Orders: Est. Patient Level III (04540)  Patient Instructions: 1)  Rule of twos: 2)  If you use the ventolin inhaler more than two times per week, wake up more than two times a month or refill ventolin more than twice a year<  call. 3)  Return as needed  Prescriptions: VENTOLIN HFA 108 (90 BASE) MCG/ACT  AERS (ALBUTEROL SULFATE) inhale 2 puffs every 4 hours as needed  #1 x 12   Entered and Authorized  by:   Storm Frisk MD   Signed by:   Storm Frisk MD on 04/04/2010   Method used:   Electronically to        [facility]* (retail)       [address]       [place], [place]  [address]       Ph: [phone]       Fax: [phone]   RxID:   [id number]    Immunization History:  Influenza Immunization History:    Influenza:  historical (12/10/2009)

## 2010-04-14 NOTE — Miscellaneous (Signed)
Summary: Skin Tests/Allergy Clinic [facility]  Skin Tests/Allergy Clinic  [facility]   Imported By: Lester [place] 06/15/2008 09:17:42  _____________________________________________________________________  External Attachment:    Type:   Image     Comment:   External Document

## 2010-05-09 ENCOUNTER — Telehealth (INDEPENDENT_AMBULATORY_CARE_PROVIDER_SITE_OTHER): Payer: Self-pay | Admitting: *Deleted

## 2010-05-18 NOTE — Progress Notes (Signed)
Summary: qvar rx--lmomtcb x3  Phone Note Call from Patient   Caller: Patient Call For: wright Summary of Call: pt requests rx for QVAR. [facility]. [phone]. pt ph# [phone] Initial call taken by: Tivis Ringer, CNA,  May 09, 2010 1:12 PM  Follow-up for Phone Call        According to last note, pt is not on qvar and was told to f/u as needed. LMTCB Vernie Murders  May 09, 2010 3:03 PM  Pt returned call.  c/o "asthma sxs" and feels she needs to restart qvar - SOB, some cough - nonprod x 4-5 days.  Denies wheezing and chest tightness.  States she is using ventolin once daily x 4 days.  States Dr. Delford Field told her at last OV if this sxs did return she would not have to come in to just call for qvar rx.  Pt states she is "not in a bind" and can feels she can wait until Thursday when PW returns to the office.  I advised will send message to him to address but in the meantime if she feels she needs to be seen or start on medication prior to Thursday to call office back.  She verbalized understanding of this. Dr. Delford Field, pls advise. Thanks!  Follow-up by: Gweneth Dimitri RN,  May 09, 2010 4:16 PM  Additional Follow-up for Phone Call Additional follow up Details #1::        this is ok to call in stn Qvar two puff two times a day #1 with 6 rf Additional Follow-up by: Storm Frisk MD,  May 09, 2010 5:19 PM    Additional Follow-up for Phone Call Additional follow up Details #2::    Rx sent to pharmacy.  LMOMTCB to inform pt of this. Gweneth Dimitri RN  May 09, 2010 5:24 PM  lmomtcb x2 Carver Fila  May 10, 2010 4:45 PM  lmomtcb x3 Carver Fila  May 11, 2010 9:37 AM   Spoke with pt and she is aware rx was sent Vernie Murders  May 11, 2010 10:23 AM   New/Updated Medications: QVAR 40 MCG/ACT AERS (BECLOMETHASONE DIPROPIONATE) Inhale 2 puffs two times a day Prescriptions: QVAR 40 MCG/ACT AERS (BECLOMETHASONE DIPROPIONATE) Inhale 2 puffs two times a  day  #1 x 6   Entered by:   Gweneth Dimitri RN   Authorized by:   Storm Frisk MD   Signed by:   Gweneth Dimitri RN on 05/09/2010   Method used:   Electronically to        [facility]* (retail)       [address]       [place], [place]  [address]       Ph: [phone]       Fax: [phone]   RxID:   [id number]

## 2010-06-14 ENCOUNTER — Other Ambulatory Visit: Payer: Self-pay | Admitting: Obstetrics and Gynecology

## 2010-06-14 DIAGNOSIS — Z1231 Encounter for screening mammogram for malignant neoplasm of breast: Secondary | ICD-10-CM

## 2010-07-04 ENCOUNTER — Ambulatory Visit
Admission: RE | Admit: 2010-07-04 | Discharge: 2010-07-04 | Disposition: A | Discharge status: Home / Self Care | Payer: 59 | Source: Ambulatory Visit | Attending: Obstetrics and Gynecology | Admitting: Obstetrics and Gynecology

## 2010-07-04 DIAGNOSIS — Z1231 Encounter for screening mammogram for malignant neoplasm of breast: Secondary | ICD-10-CM

## 2010-07-05 ENCOUNTER — Other Ambulatory Visit: Payer: Self-pay | Admitting: Obstetrics and Gynecology

## 2010-07-05 DIAGNOSIS — R928 Other abnormal and inconclusive findings on diagnostic imaging of breast: Secondary | ICD-10-CM

## 2010-07-07 ENCOUNTER — Ambulatory Visit
Admission: RE | Admit: 2010-07-07 | Discharge: 2010-07-07 | Disposition: A | Discharge status: Home / Self Care | Payer: 59 | Source: Ambulatory Visit | Attending: Obstetrics and Gynecology | Admitting: Obstetrics and Gynecology

## 2010-07-07 DIAGNOSIS — R928 Other abnormal and inconclusive findings on diagnostic imaging of breast: Secondary | ICD-10-CM

## 2010-07-25 NOTE — Assessment & Plan Note (Signed)
[facility]                             PULMONARY OFFICE NOTE   PRESLIE, DEPASQUALE                     MRN:          [id number]  DATE:08/26/2006                            DOB:          1966/02/17    [name] is a 45 year old female with history of moderate persistent  asthma, doing better with decreased cough, decreased shortness of  breath. Maintains on Qvar 40 mcg strength 2 sprays b.i.d., albuterol  p.r.n.  She has not required her rescue inhaler.  She brought to my  attention a CT of the chest that was done to evaluate her xiphoid  process in February of 2008, it showed subpleural nodules in the left  lower lobe.  She is a non smoker and on review of the nodules they are  not significant.  She is not currently having any active respiratory  complaints at this time.   EXAMINATION:  VITAL SIGNS:  Temperature is 97, blood pressure 110/68,  pulse 73, saturation 96% on room air.  CHEST:  Showed distant breath sounds without evidence of wheeze or  rhonchi.  CARDIAC EXAM:  Showed a regular rate and rhythm without S3, normal S1,  S2.  ABDOMEN:  Soft, nontender.  EXTREMITIES:  Showed no edema or clubbing.  SKIN:  Clear.   Recent RAST assay was negative.  The IG level was normal at 23.   IMPRESSION:  Mild intermittent asthma, stable at this time.   PLAN:  Maintain Qvar as is.  The CT scan is of no consequence and no  further CT scan follow-up is necessary.  Plan is to maintain albuterol  as needed.  Will see the patient back in followup in 4 months.     Bethany Cradle Delford Field, MD, [facility]  Electronically Signed    PEW/MedQ  DD: 08/27/2006  DT: 08/27/2006  Job #: [id number]   cc:   Marcelino Duster L. Vincente Poli, M.D.

## 2010-07-28 NOTE — Op Note (Signed)
NAMESHANDREKA, [name]                          ACCOUNT NO.:  [id number]   MEDICAL RECORD NO.:  [id number]                   PATIENT TYPE:  INP   LOCATION:  5038                                 FACILITY:  [facility]   PHYSICIAN:  John C. Madilyn Fireman, M.D.                 DATE OF BIRTH:  06/26/1965   DATE OF PROCEDURE:  01/15/2003  DATE OF DISCHARGE:                                 OPERATIVE REPORT   PROCEDURE:  Endoscopic retrograde cholangiopancreatography with  sphincterotomy and stone extraction.   INDICATION FOR PROCEDURE:  Symptomatic gallstones with elevated liver  function tests suggestive of common bile duct stone.   PROCEDURE:  The patient was placed in the prone position and placed on the  pulse monitor with continuous low-flow oxygen delivered by nasal cannula.  She was sedated with 120 mg IV Demerol and 12 mg IV Versed.  The Olympus  side-viewing endoscope was advanced blindly in the oropharynx, esophagus,  and stomach.  The stomach appeared grossly normal.  The pylorus was  traversed and the papilla of Vater located on the medial duodenal wall.  It  had a normal appearance.  It was cannulated with a Montez Morita  sphincterotome.  Initial injection opacified the pancreatic duct, which  appeared normal.  With repositioning the common bile duct was selectively  cannulated and the guidewire passed.  There was mild dilatation of the  common bile duct with minimal intrahepatic dilatation and a single 3-4 mm  filling defect seen freely floating in the distal duct.  A sphincterotome  was performed and several attempts were made to remove this stone with the  12 mm balloon catheter.  The balloon went around the stone and after the  sphincterotomy, there were large amounts of air that was introduced into the  duct, which made persistent visualization of the stone occasionally  difficult.  No other stones were seen.  Eventually I decided to enlarge the  sphincterotome, then recannulated with  the 12 mm balloon catheter inflated,  pulled the balloon down fully inflated, and the stone was seen to be  delivered into the duodenum.  No other filling defects were seen on repeat  cholangiogram.  The scope was then withdrawn and the patient returned to the  recovery room in stable condition.  She tolerated the procedure well, and  there were no immediate complications.   IMPRESSION:  Single common bile duct stone, status post removal after  sphincterotomy.                                               John C. Madilyn Fireman, M.D.    [facility]  D:  01/15/2003  T:  01/16/2003  Job:  [id number]   cc:   Gabrielle Dare. Janee Morn, M.D.  [facility]  [address] [place], [place] [address]  Fax: [phone]

## 2010-07-28 NOTE — Op Note (Signed)
NAME:  [name], [name]                     ACCOUNT NO.:  [id number]   MEDICAL RECORD NO.:  [id number]                   PATIENT TYPE:  INP   LOCATION:  9198                                 FACILITY:  [facility]   PHYSICIAN:  Carleta L. Vincente Poli, M.D.            DATE OF BIRTH:  [birth year]   DATE OF PROCEDURE:  04/28/2002  DATE OF DISCHARGE:                                 OPERATIVE REPORT   PREOPERATIVE DIAGNOSES:  1. Intrauterine pregnancy at 39 weeks.  2. Breech presentation.   POSTOPERATIVE DIAGNOSES:  1. Intrauterine pregnancy at 39 weeks.  2. Breech presentation.   PROCEDURE:  Primary low transverse cesarean section.   SURGEON:  Alinah L. Vincente Poli, M.D.   ANESTHESIA:  Spinal.   ESTIMATED BLOOD LOSS:  500 mL.   PROCEDURE:  The patient was taken to the operating room.  She was given  anesthesia without difficulty.  The abdomen was prepped and draped in the  usual sterile fashion.  A Foley catheter was inserted into the bladder.  A  low transverse incision was made and carried down to the fascia.  The fascia  was scored in the midline and extended laterally.  The peritoneum was  entered. The peritoneal incision was then extended, a bladder blade was  inserted and the lower uterine segment was identified.  The bladder flap was  created sharply and then digitally.  The low transverse incision was made in  the uterus and the uterus was entered using a hemostat.  Amniotic fluid was  noted to be clear.  The baby was in frank breech presentation and was  delivered without difficulty.  There were double buccal arms noted at the  time of delivery.  The cord was clamped and cut and the baby was handed to  the waiting pediatrician.  Cord blood was obtained.  The placenta was  manually removed and noted to be intact.  The uterus was then exteriorized  and it was closed in a single layer using 0 chromic in a continuous running  lock stitch.  The uterus was returned to the abdomen, was  reinspected and  noted to be hemostatic.  The peritoneum was closed with an 0 Vicryl in a  continuous running stitch starting at each angle and the rectus muscles were  closed using the same stitch.  The fascia was closed using 0 Vicryl in a  continuous running stitch starting at each corner and meeting in the  midline. After irrigation of the subcutaneous layer the skin was closed with  staples.  All sponge, lap and instrument counts were correct times two.  The  patient tolerated the procedure well and was returned to the recovery room  in stable condition.  Aliani L. Vincente Poli, M.D.    Florestine Avers  D:  04/28/2002  T:  04/28/2002  Job:  [id number]

## 2010-07-28 NOTE — Assessment & Plan Note (Signed)
[facility]                             PULMONARY OFFICE NOTE   LETHER, TESCH                     MRN:          [id number]  DATE:07/15/2006                            DOB:          03-12-1966    CHIEF COMPLAINT:  Evaluate dyspnea.   HISTORY OF PRESENT ILLNESS:  A 45 year old female who has had congestion  in the chest and cough, upper respiratory tract type infection was seen  by Urgent Care several weeks ago and treated with a course of Levaquin.  She received a 500 mg dose of this for a 5-day interval.  Her symptoms  were only partially improved.  Then this week her shortness of breath,  though slightly better still persists.  She began having some wheezing  after coughing, hearing a sound at the top of her breath when she takes  a deep breath.  When she does bring up mucous, it is still green in  nature.  It feels like mucous is stuck in the throat.  She has had  bronchitis at least three times over the past 12 months, though, she has  never been diagnosed with asthma.  She feels like she stays congested  and has hoarseness, denies any acid reflux symptoms, indigestion or  heartburn.  She is still short of breath with rest and exertion.  She  denies any hemoptysis.  There is no chest pain.  She does note some back  discomfort.  She does not sore throat, nasal congestion, and postnasal  drip.  She is a lifelong never smoker.  She is no inhaled medicines at  this time.  She is referred for further evaluation.  She was given  prednisone with the antibiotic, but she stopped this on her own and did  not take the complete prescription.   PAST MEDICAL HISTORY:  Chronic allergies, but otherwise noncontributory.   PAST SURGICAL HISTORY:  She had two cesarean sections in 2004 and 2007.  She had a cholecystectomy in 2004.   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   CURRENT MEDICATIONS:  YAZ birth control pills daily.   SOCIAL HISTORY:  Lifelong never smoker.  Works as a Theme park manager.  She lives at home with her husband and two children.   FAMILY HISTORY:  Asthma and allergies in her mother.   REVIEW OF SYSTEMS:  Otherwise noncontributory.   PHYSICAL EXAMINATION:  GENERAL:  This is a well-developed, well-  nourished female in no acute distress.  VITAL SIGNS:  Temperature 98, blood pressure 102/78, pulse 96, weight  122 pounds, saturations 100% on room air.  CHEST:  Showed distant breath sounds, few expiratory wheezes with forced  exhalation.  HEART:  Regular rate and rhythm without S3, normal S1 and S2.  ABDOMEN:  Soft and nontender.  Bowel sounds were active.  EXTREMITIES:  No edema, clubbing, or venous disease.  SKIN:  Clear.  NEUROLOGY:  Intact.  HEENT:  No jugular venous distention or lymphadenopathy.  Oropharynx is  clear.  NECK:  Supple.   Chest x-ray was obtained and showed no active disease  process.  Spirometry was obtained and was largely normal with the exception that  there was very mild peripheral air flow obstruction with FEF 2575 of 73%  predicted.   IMPRESSION:  Mild intermittent asthma, allergic rhinitis, and chronic  sinusitis.   PLAN:  The patient will receive Augmentin 875 mg b.i.d. for 7 days,  Mucinex two b.i.d., Qvar 40 mcg strength two sprays b.i.d., Nasacort two  sprays each nostril daily, generic Allegra 180 mg daily, [place] IgE assay  was obtained and we will see the patient back in follow-up in 6 weeks.     Charlcie Cradle Delford Field, MD, [facility]  Electronically Signed    PEW/MedQ  DD: 07/16/2006  DT: 07/17/2006  Job #: [id number]   cc:   Marcelino Duster L. Vincente Poli, M.D.

## 2010-07-28 NOTE — Discharge Summary (Signed)
[name], [name] NO.:  [id number]   MEDICAL RECORD NO.:  [id number]                   PATIENT TYPE:  INP   LOCATION:  5038                                 FACILITY:  [facility]   PHYSICIAN:  Gabrielle Dare. Janee Morn, M.D.             DATE OF BIRTH:  04/16/1965   DATE OF ADMISSION:  01/14/2003  DATE OF DISCHARGE:  01/17/2003                                 DISCHARGE SUMMARY   DISCHARGE DIAGNOSES:  1. Choledocholithiasis.  2. Status post laparoscopic cholecystectomy with intraoperative     cholangiogram.  3. Status post endoscopic retrograde cholangiopancreatography.   HISTORY OF PRESENT ILLNESS:  Patient is a 45 year old white female who  developed abdominal pain, nausea and vomiting the morning of admission.  She  was initially evaluated at [facility] and found to have gallstones, some  common bile duct dilatation, and elevated liver function tests.  She was  discharged there and referred to a surgeon to follow up in the next week.  Her husband is a Teacher, early years/pre and felt she should be evaluated more  expediently, and she came to our urgent office.  I found her vomiting and  with significant jaundice.  She was admitted with suspected  choledocholithiasis directly from our office.   HOSPITAL COURSE:  After admission, the patient was placed on IV antibiotics  and IV fluids.  Liver function tests were quite elevated with AST of 969,  ALT 677, alkaline phosphatase 86, bilirubin 4.3.  She had no evidence of  pancreatitis.  She underwent ERCP with sphincterotomy and removal of common  duct stone on January 15, 2003.  The following day on November 6th, she  underwent a laparoscopic cholecystectomy with intraoperative cholangiogram.  Her cholangiogram did demonstrate multiple, tiny, retained common bile duct  stones, but there was easy flow into the duodenum.  She tolerated the  procedure well.  Postoperatively, her liver function tests continued to go  down towards  normal.  She tolerated gradual advancement of her diet.  I  discussed the retained stones with the gastroenterologist, Dr. Madilyn Fireman, and he  felt she had a nice, large sphincterotomy and these would pass through.  She  remained afebrile and hemodynamically stable, tolerating a low fat diet.  Discharged home in stable condition with plans to follow up in my office.   DISCHARGE DIET:  Low fat.   DISCHARGE MEDICATIONS:  Percocet 5/325 1-2 every 6 hours p.r.n. pain.   ACTIVITY:  No lifting.   FOLLOW UP:  Within three days with myself in the office.  In the interim,  she is going to get follow-up liver function tests.                                                Gabrielle Dare Janee Morn, M.D.  BET/MEDQ  D:  02/02/2003  T:  02/02/2003  Job:  [id number]

## 2010-07-28 NOTE — Discharge Summary (Signed)
[name], [name]              ACCOUNT NO.:  [id number]   MEDICAL RECORD NO.:  [id number]          PATIENT TYPE:  INP   LOCATION:  NA                            [facility]   PHYSICIAN:  Zakariah L. Grewal, M.D.DATE OF BIRTH:  Jan 20, 1966   DATE OF ADMISSION:  04/23/2005  DATE OF DISCHARGE:  04/26/2005                                 DISCHARGE SUMMARY   ADMITTING DIAGNOSES:  1.  Intrauterine pregnancy at 35-6/7 weeks estimated gestational age.  2.  Preterm labor unresponsive to tocolytics.  3.  Previous cesarean delivery.   DISCHARGE DIAGNOSIS:  1.  Status post low transverse cesarean section.  2.  Viable female infant.   PROCEDURE:  Repeat low transverse cesarean section.   REASON FOR ADMISSION:  Please see written H&P.   HOSPITAL COURSE:  Patient is 45 year old gravida 2, para 1 that was admitted  to [facility] at 35-6/7 weeks with onset of contractions  now occurring approximately every 3 minutes.  Patient has history of  previous cesarean delivery for a breech presentation.  Patient was  administered terbutaline times 2 and IV fluid bolus without tocolysis of her  labor.  Contractions continued to be every 3 minutes.  She denied vaginal  bleeding or loss of amniotic fluid.  Due to previous cesarean delivery and  patient now in active labor, decision was made to proceed with a repeat low  transverse cesarean section.  The patient was then transferred to the  operating room where spinal anesthesia was administered without difficulty.  Low transverse incision was made with delivery of a viable female infant  weighing 6 pounds 14 ounces Apgars of 9 at 1 minute, 9 at 5 minutes.  Arterial cord pH was 7.27.  The patient tolerated procedure well and was  taken to the recovery room in stable condition.   On postoperative day 1, the patient was without complaint.  Baby was stable  in the NICU, vital signs were stable.  She was afebrile.  Abdomen soft, good  return of bowel function.  Fundus was firm and nontender.  Abdominal  dressing was noted to be clean, dry and intact.  Laboratory findings  revealed hemoglobin of 10.6, platelet count of 207,000, WBC count of 10.1.   On postoperative day 2, patient was without complaint.  The baby continued  to be stable in the NICU.  Vital signs were stable.  She was afebrile.  Fundus firm and nontender.  Incision was clean, dry and intact.  Some small  ecchymosis was noted superior and inferior to the incisional site.  Patient  was ambulating well.   On postoperative day 3, patient was without complaint.  Vital signs were  stable.  Fundus firm and nontender.  Incision was clean, dry and intact.  Staples were removed and the patient was later discharged home.   CONDITION ON DISCHARGE:  Stable.   DIET:  Regular as tolerated.   ACTIVITY:  No heavy lifting.  No driving x2 weeks.  No vaginal entry.   FOLLOW UP:  Patient to follow up in the office in 1 week for an  incision  check.  She is to call for a temperature greater than 100 degrees,  persistent nausea/vomiting, heavy vaginal bleeding and/or redness or  drainage from the incisional site.   DISCHARGE MEDICATIONS:  Tylox #30 1 p.o. every 4-6 hours p.r.n., Motrin 600  mg every 6 hours, prenatal vitamins one p.o. daily, Colace 1 p.o. daily  p.r.n.      Julio Sicks, N.P.      Stann Mainland. Vincente Poli, M.D.  Electronically Signed    CC/MEDQ  D:  06/19/2005  T:  06/19/2005  Job:  [id number]

## 2010-07-28 NOTE — Op Note (Signed)
[name], [name]              ACCOUNT NO.:  [id number]   MEDICAL RECORD NO.:  [id number]          PATIENT TYPE:  INP   LOCATION:  9128                          [facility]   PHYSICIAN:  Zelphia Cairo, MD    DATE OF BIRTH:  1965/05/14   DATE OF PROCEDURE:  04/23/2005  DATE OF DISCHARGE:                                 OPERATIVE REPORT   PREOPERATIVE DIAGNOSIS:  1.  Intrauterine pregnancy at 35-6/7 weeks.  2.  Prior cesarean delivery.  3.  Preterm labor.   POSTOPERATIVE DIAGNOSIS:  1.  Intrauterine pregnancy at 35-6/7 weeks.  2.  Prior cesarean delivery.  3.  Preterm labor.   PROCEDURE:  Repeat low transverse cesarean section.   FINDINGS:  Viable female infant with Apgars of 9 and 9, weighing  pounds 14  ounces, cord pH 7.27.   SURGEON:  Zelphia Cairo, M.D.   ANESTHESIA:  Spinal.   SPECIMENS:  Placenta.   COMPLICATIONS:  None.   DISPOSITION:  To recovery room.   DESCRIPTION OF PROCEDURE:  The patient was taken to the operating room where  a spinal anesthesia was obtained.  She was placed in the supine position  with a left tilt.  She was prepped and draped in the usual sterile fashion.  A skin incision was made using the old uterine scar with the scalpel.  This  was carried down to the underlying fascia.  The fascia was incised in the  midline and extended laterally using Mayo scissors.  The fascia was then  grasped with Kocher clamps and the superior portion tented upward, and the  underlying rectus muscles were dissected off using the Bovie.  Attention was  then turned to the inferior portion of the fascia which was tented up with  Kocher clamps and dissected off the underlying rectus muscles with the  Bovie.  The peritoneum was then identified and entered bluntly.  This was  extended superiorly and inferiorly with good visualization of the bladder.  The bladder blade was then inserted.  The bladder flap was created using  Metzenbaum scissors.  The bladder  blade was repositioned to protect the  bladder.  Uterine incision was made with the scalpel and this was extended  laterally bluntly.  The infant's head was brought to the incision without  difficulty.  However, due to her tight rectus muscles, a vacuum was placed  to allow for an extra hand to retract the muscles.  This was done without  difficulty.  The suction was then removed.  The infant's shoulders and body  easily followed.  The cord was clamped and cut as the infant's nose and  mouth were suctioned.  The infant was then taken to the awaiting  pediatrician.  The placenta delivered intact.  Cord gases and cord blood  were collected.  The uterus was exteriorized and the uterine incision was  closed with 1-0 chromic in a running locked fashion.  Excellent hemostasis  was noted.  The uterus was placed back into the pelvis and the pelvis was  copiously irrigated with warm normal saline.  The peritoneum was then  closed  with 1-0 Vicryl.  The fascia was closed with looped PDS.  The subcutaneous  tissue was irrigated and the skin was closed with staples.  10 mL of 1%  lidocaine was given during closure of the skin to provide local analgesia.  Needle, sponge, and instrument counts correct x2.  She was given 1 gram of  Ancef at cord clamp, taken to the recovery room in stable condition.      Zelphia Cairo, MD  Electronically Signed     GA/MEDQ  D:  04/23/2005  T:  04/23/2005  Job:  [id number]

## 2010-07-28 NOTE — Consult Note (Signed)
NAMEJUN, RIGHTMYER                          ACCOUNT NO.:  [id number]   MEDICAL RECORD NO.:  [id number]                   PATIENT TYPE:  INP   LOCATION:  5038                                 FACILITY:  [facility]   PHYSICIAN:  James L. Malon Kindle., M.D.          DATE OF BIRTH:  08-12-1965   DATE OF CONSULTATION:  01/14/2003  DATE OF DISCHARGE:                                   CONSULTATION   REASON FOR CONSULTATION:  Possible common bile duct stone.   HISTORY:  A 45 year old Caucasian female from [place].  She has had  approximately six months of vague postprandial discomfort located in the  epigastrium, going through to the back that would last for a few minutes and  then resolve.  It would often go away for a long period of time.  It is  notable that she is eight months postpartum, had a difficult time getting  pregnant, had multiple in vitro fertilizations and failed implantations.  Approximately 24 hours ago, she developed severe epigastric pain going  through to the back with nausea and vomiting.  It was quite severe.  She  went to the emergency room in [place] and was found to be jaundiced.   Labs obtained from there revealed AST 969, ALT 677, with normal alkaline  phosphatase, total bilirubin 4.3.  The patient's platelets were normal.  White count was normal at 8.1.  Urinalysis was negative as well as ketones.  Amylase and lipase were normal.  The patient notes that her eyes have been  yellow, and they are slightly less yellow now than they had been.  She  clinically feels better, although she was vomiting earlier this afternoon.   She had a gallbladder ultrasound done at [facility] this morning showing  cholelithiasis with normal caliber common.  The distal common duct had  possibly slight intrahepatic dilatation.  The patient was referred to a  surgeon whom she was due to see next week, but due to continued pain and  vomiting, she came down and saw Dr. Janee Morn in the  office today.   CURRENT MEDICATIONS:  Prenatal vitamins.   ALLERGIES:  SULFA.   PAST MEDICAL HISTORY:  No chronic medical problems and no surgery other than  surgery on her eyes as a child and several in vitro fertilization procedures  that were basically done without any kind of laparotomy.   FAMILY HISTORY:  Mother had gallstones at about her age.   SOCIAL HISTORY:  She is married.  Her husband is a Teacher, early years/pre up in  [place].  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   PHYSICAL EXAMINATION:  VITAL SIGNS:  Temperature 98.6, O2 saturation 98%.  Blood pressure 132/79.  GENERAL:  A pleasant, young female.  HEENT:  Eyes have slight scleral icterus.  Extraocular movements intact.  LUNGS:  Clear.  HEART:  Regular rate and rhythm without murmurs or gallops.  ABDOMEN:  Nondistended with good bowel sounds.  Mild to moderate tenderness  in the right upper quadrant.  No guarding, rigidity, or rebound.   ASSESSMENT:  Choledocholithiasis with jaundice indicative of common duct  stone.  I certainly agree that, given the degree of jaundice she has had and  symptoms, it would be quite appropriate to be a preoperative ERCP.  I have  discussed this in detail with her.  The procedure has been discussed at  length including the complications of bleeding and pancreatitis of  approximately 5%.  The procedure was explained, and she is aware that my  partner more than likely, Dr. Madilyn Fireman, will be performing the procedure.   PLAN:  Preoperative ERCP tomorrow with a hopeful stone extraction with  subsequent cholecystectomy over the weekend.  I agree with keeping her  n.p.o. other than ice chips and agree with antibiotics.                                               James L. Malon Kindle., M.D.    Waldron Session  D:  01/14/2003  T:  01/15/2003  Job:  [id number]

## 2010-07-28 NOTE — Op Note (Signed)
[name], [name] NO.:  [id number]   MEDICAL RECORD NO.:  [id number]                   PATIENT TYPE:  INP   LOCATION:  5038                                 FACILITY:  [facility]   PHYSICIAN:  Gabrielle Dare. Janee Morn, M.D.             DATE OF BIRTH:  [birth year]   DATE OF PROCEDURE:  01/16/2003  DATE OF DISCHARGE:                                 OPERATIVE REPORT   PREOPERATIVE DIAGNOSES:  Choledocholithiasis.   POSTOPERATIVE DIAGNOSIS:  Choledocholithiasis.   PROCEDURE:  Laparoscopic cholecystectomy with intraoperative cholangiogram.   SURGEON:  Gabrielle Dare. Janee Morn, M.D.   ANESTHESIA:  General.   HISTORY OF PRESENT ILLNESS:  The patient is a 45 year old white female, for  whom I admitted from our urgent office with choledocholithiasis on January 14, 2003.  She underwent ERCP yesterday, with removal of common bile duct  stones and enterotomy.  She is brought in for cholecystectomy this morning.   PROCEDURE IN DETAIL:  Informed consent was obtained.  The patient is on  intravenous antibiotics.  She was brought to the operating room and general  anesthesia was administered.  Her abdomen was prepped and draped in sterile  fashion.  A curvilinear infraumbilical incision was made.  Subcutaneous  tissues were dissected down.  Anterior fascia was divided and the peritoneal  cavity was entered under direct vision without difficulty.  A pursestring  suture of 0 Vicryl was placed around the fascial opening.  The Hasson trocar  was inserted into the abdomen.  The abdomen was insufflated with carbon  dioxide in standard fashion.  Under direct vision, an 11 mm epigastric and  two 5 mm lateral ports were placed.  The dome of the gallbladder was  retracted superomedially.  The infundibulum was first cleared of filmy  omental attachments, and then retracted inferolaterally.  Dissection began  laterally and progressed medially, easily revealing the cystic duct and  cystic artery which were both circumferentially dissected, creating a large  window around the cystic duct, between the infundibulum, the cystic duct and  the liver.  A nice window was also made around the cystic artery, and the  cystic artery was clipped twice proximally, once distally and divided.  A  clip was then placed at the infundibulo-cystic duct junction.  A small nick  was made in the cystic duct.  Several tiny stones were milked back from the  cystic duct and out.  Interment with suction catheter.  A Reddick  cholangiogram catheter was then inserted.   Intraoperative cholangiogram demonstrated multiple tiny stones retained in  the distal common bile duct.  There was easy flow of contrast into the  duodenum.  The stomach appeared to flush through.  The cholangiogram was  completed.  Three clips were placed proximally on the cystic duct, and it  was divided.  Further dissection revealed a posterior branch of the cystic  artery, which was clipped twice  proximally, divided sharply above.   The gallbladder was then taken off the liver bed with the Bovie cautery.  Careful areas of the liver bed were cauterized.  Again, excellent  hemostasis.  The gallbladder was placed in a net catch bag and removed via  the umbilical port site.  The gallbladder bed was then checked for  hemostasis.  Two areas were cauterized until all bleeding was controlled.  The abdomen was copiously irrigated with 2 L of saline.  The irrigant  returned clear.  The liver bed was rechecked and noted to be hemostatic  under direct vision.  The ports were removed.  Pneumoperitoneum was released  and the Hasson trocar was removed.  The umbilical fascia was closed by tying  the 0 Vicryl pursestring suture, with care not to trap the intra-abdominal  contents or bowel.  All four wounds were copiously irrigated with 0.25%  Marcaine with epinephrine, which had been used for a local anesthesia.  The  skin of each were  closed with a running 4-0 Vicryl subcuticular stitch.  Sponge, needle and instrument counts were correct.  The patient tolerated  the procedure well without complication.  Taken to the recovery room in  stable condition.  Benzoin and Steri-Strips and sterile dressings were  applied to the wounds.                                               Gabrielle Dare Janee Morn, M.D.    BET/MEDQ  D:  01/16/2003  T:  01/16/2003  Job:  [id number]

## 2010-07-28 NOTE — H&P (Signed)
NAME:  [name], [name]                        ACCOUNT NO.:  [id number]   MEDICAL RECORD NO.:  [id number]                   PATIENT TYPE:  INP   LOCATION:                                       FACILITY:  [facility]   PHYSICIAN:  Gabrielle Dare. Janee Morn, M.D.             DATE OF BIRTH:  1965-11-01   DATE OF ADMISSION:  01/14/2003  DATE OF DISCHARGE:  01/17/2003                                HISTORY & PHYSICAL   ADMISSION DIAGNOSIS:  Choledocholithiasis.   CHIEF COMPLAINT:  Epigastric abdominal pain, nausea and vomiting.   HISTORY OF PRESENT ILLNESS:  The patient is a 45 year old female who  developed epigastric abdominal pain with nausea and vomiting earlier this  A.M.  She was evaluated at the [facility] emergency department.  Ultrasound  then showed gallstones with minimally dilated common bile duct.  Labs showed  elevated liver function tests and she was referred to see a surgeon next  week in [place].  Her husband, who is a Teacher, early years/pre, felt that she should  be evaluated sooner and she came to our urgent office.   Currently the patient has had several episodes of vomiting in our office and  is quite ill-feeling.  She is still having some epigastric pain radiating to  her back, but no other complaints.   PAST MEDICAL HISTORY:  Past medical history is negative.   PAST SURGICAL HISTORY:  C-section in February.   FAMILY HISTORY:  The patient's father passed away with unknown illnesses.  Mother and brother are alive and well.   SOCIAL HISTORY:  The patient does not smoke or drink alcohol.   MEDICATIONS:  Prenatal vitamins.   ALLERGIES:  SULFA.   REVIEW OF SYSTEMS:  CONSTITUTIONAL:  Negative, except for fatigue.  EYES:  Negative.  EARS, NOSE AND THROAT:  Negative.  CARDIOVASCULAR:  Negative.  PULMONARY:  Negative.  GASTROINTESTINAL: Please see the history of present  illness.  GENITOURINARY:  No complaints.  The remainder of the review of  systems is negative.   PHYSICAL  EXAMINATION:  VITAL SIGNS:  Weight is 125 pounds.  Blood pressure  114/80 and pulse 72.  GENERAL APPEARANCE:  The patient is awake, alert and rather ill-appearing.  HEENT:  Pupils are equal.  Sclerae are icteric bilaterally.  NECK:  Neck is supple with no thyroid masses.  LUNGS:  Lungs are clear to auscultation bilaterally.  HEART:  Heart has a regular rate and rhythm.  PMI is palpable in the left  chest.  ABDOMEN:  The patient's abdomen is soft with some mild epigastric  tenderness.  No masses are palpable.  SKIN:  The patient's skin has some generalized jaundice.  EXTREMITIES:  Strength is equal in the upper and lower extremities on  musculoskeletal examination.   DATA REVIEWED:  Data reviewed includes white blood cell count of 8.1.  Amylase 63 and lipase 38.  AST 969 and ALT 677.  Alkaline  phosphatase 86 and  bilirubin 4.3.  Ultrasound demonstrates cholelithiasis with mildly dilated  common bile duct.   IMPRESSION AND PLAN:  Choledocholithiasis.   PLAN:  1. Admit the patient directly to [facility] from our office.  2. Give her some IV fluid resuscitation.  3. Start her on IV antibiotics.  4. Give her some pain medication and medication for her nausea.  5. I spoke with Dr. Vilinda Boehringer from gastroenterology to evaluate her for     possible ERCP tomorrow and we will follow that with laparoscopic     cholecystectomy.   This was discussed in detail with the patient and her husband, and questions  were answered.                                                Gabrielle Dare Janee Morn, M.D.    BET/MEDQ  D:  01/14/2003  T:  01/14/2003  Job:  [id number]

## 2010-07-28 NOTE — Discharge Summary (Signed)
[name], [name] NO.:  [id number]   MEDICAL RECORD NO.:  [id number]                   PATIENT TYPE:   LOCATION:                                       FACILITY:   PHYSICIAN:  Freddy Finner, M.D.                DATE OF BIRTH:   DATE OF ADMISSION:  04/28/2002  DATE OF DISCHARGE:  05/01/2002                                 DISCHARGE SUMMARY   ADMISSION DIAGNOSES:  1. Intrauterine pregnancy at 48 weeks estimated gestational age.  2. Breech presentation.   DISCHARGE DIAGNOSES:  1. Status post low transverse Cesarean section.  2. Viable female infant.   PROCEDURE:  Primary low transverse Cesarean section.   REASON FOR ADMISSION:  Please see written history and physical.   HISTORY OF PRESENT ILLNESS:  The patient was admitted to [facility] at 39 weeks estimated gestational age for Cesarean section  delivery. The patient had known breech presentation.   HOSPITAL COURSE:  The patient was taken to the OR where spinal anesthesia  was administered without difficulty. A low transverse incision was made with  the delivery of a viable female infant weighing 7 pounds and 5 ounces with  Apgar's of 8 at one minute and 9 at five minutes. The patient tolerated the  procedure well and was taken to the recovery room in stable condition. On  postoperative day one, the patient had good return of bowel function.  Abdomen was soft and nontender. Fundus was firm. Abdominal dressing was  noted to be clean, dry, and intact. Labs revealed hemoglobin of 10.3,  platelet count of 217,000, WBC count of 12.7. On postoperative day two,  abdominal dressing was removed revealing incision that was clean, dry and  intact. The patient was ambulating without assistance, tolerating a regular  diet without complaints of nausea and vomiting. Fundus was firm and non-  tender. On postoperative day three, incision was clean, dry and intact. The  patient remained  afebrile. Staples removed and the patient was discharged to  home.   CONDITION ON DISCHARGE:  Good.   DIET:  Regular as tolerated.   ACTIVITY:  No heavy lifting, no driving times two weeks, no vaginal entry.   FOLLOW UP:  The patient is to follow-up in the office in one week for an  incision check. She is to call for temperature greater than 100 degrees,  persistent nausea and vomiting, heavy vaginal bleeding, and redness or  drainage from the incisional site.   DISCHARGE MEDICATIONS:  1. Percocet 5/325 mg #30 one by mouth every four to six hours as needed     pain.  2.     Motrin 600 mg #30 one by mouth every six hours as needed.  3. Prenatal vitamins one by mouth daily.  4. Colace one by mouth daily as needed.     Julio Sicks, N.P.  Freddy Finner, M.D.    CC/MEDQ  D:  05/19/2002  T:  05/20/2002  Job:  [id number]   cc:   Freddy Finner, M.D.  [address] [place]  [place] [address]  Fax: [phone]

## 2011-07-10 ENCOUNTER — Other Ambulatory Visit: Payer: Self-pay | Admitting: Obstetrics and Gynecology

## 2011-07-10 DIAGNOSIS — Z1231 Encounter for screening mammogram for malignant neoplasm of breast: Secondary | ICD-10-CM

## 2011-08-02 ENCOUNTER — Ambulatory Visit
Admission: RE | Admit: 2011-08-02 | Discharge: 2011-08-02 | Disposition: A | Discharge status: Home / Self Care | Payer: BC Managed Care – PPO | Source: Ambulatory Visit | Attending: Obstetrics and Gynecology | Admitting: Obstetrics and Gynecology

## 2011-08-02 DIAGNOSIS — Z1231 Encounter for screening mammogram for malignant neoplasm of breast: Secondary | ICD-10-CM

## 2012-03-24 ENCOUNTER — Other Ambulatory Visit: Payer: Self-pay | Admitting: Obstetrics and Gynecology

## 2012-07-02 ENCOUNTER — Other Ambulatory Visit: Payer: Self-pay

## 2012-07-02 DIAGNOSIS — Z1231 Encounter for screening mammogram for malignant neoplasm of breast: Secondary | ICD-10-CM

## 2012-08-05 ENCOUNTER — Ambulatory Visit
Admission: RE | Admit: 2012-08-05 | Discharge: 2012-08-05 | Disposition: A | Discharge status: Home / Self Care | Payer: BC Managed Care – PPO | Source: Ambulatory Visit

## 2012-08-05 DIAGNOSIS — Z1231 Encounter for screening mammogram for malignant neoplasm of breast: Secondary | ICD-10-CM

## 2013-07-08 ENCOUNTER — Other Ambulatory Visit: Payer: Self-pay

## 2013-07-08 ENCOUNTER — Other Ambulatory Visit: Payer: Self-pay | Admitting: Obstetrics and Gynecology

## 2013-07-08 DIAGNOSIS — Z1231 Encounter for screening mammogram for malignant neoplasm of breast: Secondary | ICD-10-CM

## 2013-08-11 ENCOUNTER — Ambulatory Visit
Admission: RE | Admit: 2013-08-11 | Discharge: 2013-08-11 | Disposition: A | Discharge status: Home / Self Care | Payer: BC Managed Care – PPO | Source: Ambulatory Visit

## 2013-08-11 ENCOUNTER — Encounter (INDEPENDENT_AMBULATORY_CARE_PROVIDER_SITE_OTHER): Payer: Self-pay

## 2013-08-11 DIAGNOSIS — Z1231 Encounter for screening mammogram for malignant neoplasm of breast: Secondary | ICD-10-CM

## 2014-05-12 ENCOUNTER — Other Ambulatory Visit: Payer: Self-pay

## 2014-05-12 DIAGNOSIS — Z1231 Encounter for screening mammogram for malignant neoplasm of breast: Secondary | ICD-10-CM

## 2014-08-30 ENCOUNTER — Other Ambulatory Visit: Payer: Self-pay | Admitting: Obstetrics and Gynecology

## 2014-08-31 LAB — CYTOLOGY - PAP

## 2014-09-09 ENCOUNTER — Ambulatory Visit
Admission: RE | Admit: 2014-09-09 | Discharge: 2014-09-09 | Disposition: A | Discharge status: Home / Self Care | Payer: BLUE CROSS/BLUE SHIELD | Source: Ambulatory Visit

## 2014-09-09 DIAGNOSIS — Z1231 Encounter for screening mammogram for malignant neoplasm of breast: Secondary | ICD-10-CM

## 2014-09-14 ENCOUNTER — Other Ambulatory Visit: Payer: Self-pay | Admitting: Obstetrics and Gynecology

## 2014-09-14 DIAGNOSIS — R928 Other abnormal and inconclusive findings on diagnostic imaging of breast: Secondary | ICD-10-CM

## 2014-09-20 ENCOUNTER — Ambulatory Visit
Admission: RE | Admit: 2014-09-20 | Discharge: 2014-09-20 | Disposition: A | Discharge status: Home / Self Care | Payer: BLUE CROSS/BLUE SHIELD | Source: Ambulatory Visit | Attending: Obstetrics and Gynecology | Admitting: Obstetrics and Gynecology

## 2014-09-20 ENCOUNTER — Other Ambulatory Visit: Payer: BLUE CROSS/BLUE SHIELD

## 2014-09-20 DIAGNOSIS — R928 Other abnormal and inconclusive findings on diagnostic imaging of breast: Secondary | ICD-10-CM

## 2015-03-23 ENCOUNTER — Institutional Professional Consult (permissible substitution): Payer: BLUE CROSS/BLUE SHIELD | Admitting: Pulmonary Disease

## 2015-07-19 ENCOUNTER — Other Ambulatory Visit: Payer: Self-pay

## 2015-07-19 DIAGNOSIS — Z1231 Encounter for screening mammogram for malignant neoplasm of breast: Secondary | ICD-10-CM

## 2015-09-20 ENCOUNTER — Ambulatory Visit
Admission: RE | Admit: 2015-09-20 | Discharge: 2015-09-20 | Disposition: A | Discharge status: Home / Self Care | Payer: BLUE CROSS/BLUE SHIELD | Source: Ambulatory Visit

## 2015-09-20 ENCOUNTER — Other Ambulatory Visit: Payer: Self-pay | Admitting: Obstetrics and Gynecology

## 2015-09-20 DIAGNOSIS — Z1231 Encounter for screening mammogram for malignant neoplasm of breast: Secondary | ICD-10-CM

## 2016-12-03 ENCOUNTER — Other Ambulatory Visit: Payer: Self-pay | Admitting: Obstetrics and Gynecology

## 2016-12-03 DIAGNOSIS — Z1231 Encounter for screening mammogram for malignant neoplasm of breast: Secondary | ICD-10-CM

## 2016-12-13 ENCOUNTER — Ambulatory Visit: Payer: BLUE CROSS/BLUE SHIELD

## 2016-12-25 ENCOUNTER — Ambulatory Visit
Admission: RE | Admit: 2016-12-25 | Discharge: 2016-12-25 | Disposition: A | Discharge status: Home / Self Care | Payer: BLUE CROSS/BLUE SHIELD | Source: Ambulatory Visit | Attending: Obstetrics and Gynecology | Admitting: Obstetrics and Gynecology

## 2016-12-25 DIAGNOSIS — Z1231 Encounter for screening mammogram for malignant neoplasm of breast: Secondary | ICD-10-CM

## 2017-10-30 ENCOUNTER — Other Ambulatory Visit: Payer: Self-pay | Admitting: Obstetrics and Gynecology

## 2017-10-30 DIAGNOSIS — Z1231 Encounter for screening mammogram for malignant neoplasm of breast: Secondary | ICD-10-CM

## 2017-12-27 ENCOUNTER — Ambulatory Visit: Payer: BLUE CROSS/BLUE SHIELD

## 2017-12-30 ENCOUNTER — Ambulatory Visit
Admission: RE | Admit: 2017-12-30 | Discharge: 2017-12-30 | Disposition: A | Discharge status: Home / Self Care | Payer: BLUE CROSS/BLUE SHIELD | Source: Ambulatory Visit | Attending: Obstetrics and Gynecology | Admitting: Obstetrics and Gynecology

## 2017-12-30 DIAGNOSIS — Z1231 Encounter for screening mammogram for malignant neoplasm of breast: Secondary | ICD-10-CM

## 2019-01-05 ENCOUNTER — Other Ambulatory Visit: Payer: Self-pay | Admitting: Obstetrics and Gynecology

## 2019-01-05 DIAGNOSIS — Z1231 Encounter for screening mammogram for malignant neoplasm of breast: Secondary | ICD-10-CM

## 2019-02-25 ENCOUNTER — Ambulatory Visit: Payer: Managed Care, Other (non HMO)

## 2019-04-08 ENCOUNTER — Ambulatory Visit: Payer: Managed Care, Other (non HMO)

## 2019-06-15 ENCOUNTER — Other Ambulatory Visit: Payer: Self-pay

## 2019-06-15 ENCOUNTER — Ambulatory Visit
Admission: RE | Admit: 2019-06-15 | Discharge: 2019-06-15 | Disposition: A | Discharge status: Home / Self Care | Payer: Managed Care, Other (non HMO) | Source: Ambulatory Visit | Attending: Obstetrics and Gynecology | Admitting: Obstetrics and Gynecology

## 2019-06-15 DIAGNOSIS — Z1231 Encounter for screening mammogram for malignant neoplasm of breast: Secondary | ICD-10-CM

## 2020-02-25 ENCOUNTER — Ambulatory Visit: Payer: Managed Care, Other (non HMO) | Admitting: Internal Medicine

## 2020-02-25 ENCOUNTER — Encounter: Payer: Self-pay | Admitting: Internal Medicine

## 2020-02-25 VITALS — BP 104/76 | HR 93 | Temp 98.2°F | Ht 62.0 in | Wt 148.2 lb

## 2020-02-25 DIAGNOSIS — R1013 Epigastric pain: Secondary | ICD-10-CM

## 2020-02-25 DIAGNOSIS — R0602 Shortness of breath: Secondary | ICD-10-CM | POA: Diagnosis not present

## 2020-02-25 LAB — NITRIC OXIDE: Nitric Oxide: 5

## 2020-02-25 NOTE — Progress Notes (Signed)
[name]    [id number]    10/25/1965  Primary Care Physician:No primary care provider on file.  Referring Physician: self referral Reason for Consultation: asthma Date of Consultation: 02/25/2020  Chief complaint:   Chief Complaint  Patient presents with  . Consult    Asthma-woke up at night and shortness of breath, had to use albuterol a little more has albuterol inhaler uses once a month for the past 6 months     HPI:  [name] is a 53 y.o. woman with history of previously diagnosed asthma. Not currently on any therapy besides prn albuterol which she uses less than once/month for chest tightness.   She is here for episodes of substernal chest/abdominal pain that woke her up in her sleep. She has pain as if someone kicked her in the stomach and she has a muscle spasm and she has to "knead" it out.  These episodes are once every 3 months, most recently 2 weeks ago. Most usually in the middle of the night. She feels like she can't get a deep breath in or that she can't talk. No wheezing, chest tightness or cough.  Pain lasts for 10 minutes and resolves spontaneously.   She took albuterol at the advice of her husband who is a Teacher, early years/pre, and felt better about 15 minutes later. This has happened before while riding in the car. She does have reflux starting with her pregnancy. She takes occasional protonix when she has these symptoms. She hasn't noticed a relationship with food and these symptoms.   She does have hemorrhoids and some upset stomach as well and is seeing GI to get more definitive therapy on this.   In between these episodes of dyspnea she doesn't get sob or cough so much that affects her quality of life.     Asthma Control Test ACT Total Score  02/25/2020 19   FeNO: 5  Social history:  Occupation: She works for [employer] for a [employer], working 7 days a week. She has a very stressful job.  Exposures: lives at home with husband, two  kids. Getting a puppy soon.  Smoking history: never smoker, no passive smoke exposure  Social History   Occupational History  . Not on file  Tobacco Use  . Smoking status: Never Smoker  . Smokeless tobacco: Never Used  Substance and Sexual Activity  . Alcohol use: Not on file  . Drug use: Not on file  . Sexual activity: Not on file    Relevant family history:  Family History  Problem Relation Age of Onset  . Breast cancer Maternal Aunt   . Stroke Mother   . Lung cancer Mother   . Parkinsonism Father     Past Medical History:  Diagnosis Date  . Asthma     Past Surgical History:  Procedure Laterality Date  . CHOLECYSTECTOMY    . HEMORRHOID SURGERY       Physical Exam: Blood pressure 104/76, pulse 93, temperature 98.2 F (36.8 C), temperature source Temporal, height 5\' 2"  (1.575 m), weight 148 lb 3.2 oz (67.2 kg), SpO2 99 %. Gen:      No acute distress ENT:  no nasal polyps, mucus membranes moist Lungs:    No increased respiratory effort, symmetric chest wall excursion, clear to auscultation bilaterally, no wheezes or crackles CV:         Regular rate and rhythm; no murmurs, rubs, or gallops.  No pedal edema Abd:      +  bowel sounds; soft, non-tender; no distension MSK: no acute synovitis of DIP or PIP joints, no mechanics hands.  Skin:      Warm and dry; no rashes Neuro: normal speech, no focal facial asymmetry Psych: alert and oriented x3, normal mood and affect   Data Reviewed/Medical Decision Making:  Independent interpretation of tests: Imaging: . Review of patient's chest xray 2012 reviewed, images revealed no acute process. The patient's images have been independently reviewed by me.    PFTs: Spirometry 2009 showed no airflow limitation - performed on CPET.  Spirometry 2012 normal.  Labs:  Food allergy IgG panel reviewed, diffusely positive.   Immunization status:  Immunization History  Administered Date(s) Administered  . Influenza Whole  12/10/2009  . Moderna Sars-Covid-2 Vaccination 03/24/2019, 04/21/2019, 02/03/2020    . I reviewed prior external note(s) from pulmonary.  . I reviewed the result(s) of the labs and imaging as noted above.  . I have ordered Exhaled NO  Assessment:  Substernal Chest/Abdominal pain  Shortness of breath Possible mild intermittent asthma   Plan/Recommendations: [name]'s symptoms of substernal chest pain which usually occur at night and are not associated with chest tightness, cough or wheezing don't seem consistent with asthma. Her previous asthma work up has been unremarkable and she has not responded consistently to ICS or SABA in the past. I wonder if her symptoms could be related to reflux. I recommend she start taking PPI daily 40 mg, at least until she follows up with GI. I obtained exhaled NO testing today and it was not elevated.   I am happy to see her back as needed. We discussed how if GI evaluation does not improve her symptoms, we can repeat pulmonary function testing. I really don't think a trial of ICS would be useful right now.  She can keep her albuterol inhaler prn  Since her use is minimal.   We discussed disease management and progression at length today.    Return to Care: Return if symptoms worsen or fail to improve.  Durel Salts, MD Pulmonary and Critical Care Medicine [facility] [phone]  CC: No ref. provider found

## 2020-05-02 ENCOUNTER — Other Ambulatory Visit: Payer: Self-pay | Admitting: Obstetrics and Gynecology

## 2020-05-02 DIAGNOSIS — Z1231 Encounter for screening mammogram for malignant neoplasm of breast: Secondary | ICD-10-CM

## 2020-06-22 ENCOUNTER — Other Ambulatory Visit: Payer: Self-pay

## 2020-06-22 ENCOUNTER — Ambulatory Visit
Admission: RE | Admit: 2020-06-22 | Discharge: 2020-06-22 | Disposition: A | Discharge status: Home / Self Care | Payer: Managed Care, Other (non HMO) | Source: Ambulatory Visit | Attending: Obstetrics and Gynecology | Admitting: Obstetrics and Gynecology

## 2020-06-22 DIAGNOSIS — Z1231 Encounter for screening mammogram for malignant neoplasm of breast: Secondary | ICD-10-CM

## 2021-01-02 ENCOUNTER — Telehealth: Payer: Self-pay | Admitting: Internal Medicine

## 2021-01-02 MED ORDER — ALBUTEROL SULFATE HFA 108 (90 BASE) MCG/ACT IN AERS: 1.0000 | INHALATION_SPRAY | Freq: Four times a day (QID) | RESPIRATORY_TRACT | 1 refills | Status: AC | PRN

## 2021-01-02 NOTE — Telephone Encounter (Signed)
I called the patient and she reports that she is doing well over all and was offered an OV and declined at this time. She just wanted an albuterol  for back up and will call back if she develops any new symptoms. No other concerns.

## 2021-04-04 ENCOUNTER — Other Ambulatory Visit: Payer: Self-pay | Admitting: Obstetrics and Gynecology

## 2021-04-04 DIAGNOSIS — Z1231 Encounter for screening mammogram for malignant neoplasm of breast: Secondary | ICD-10-CM

## 2021-06-27 ENCOUNTER — Ambulatory Visit
Admission: RE | Admit: 2021-06-27 | Discharge: 2021-06-27 | Disposition: A | Discharge status: Home / Self Care | Payer: Managed Care, Other (non HMO) | Source: Ambulatory Visit | Attending: Obstetrics and Gynecology | Admitting: Obstetrics and Gynecology

## 2021-06-27 DIAGNOSIS — Z1231 Encounter for screening mammogram for malignant neoplasm of breast: Secondary | ICD-10-CM

## 2022-05-07 ENCOUNTER — Other Ambulatory Visit: Payer: Self-pay | Admitting: Obstetrics and Gynecology

## 2022-05-07 DIAGNOSIS — Z1231 Encounter for screening mammogram for malignant neoplasm of breast: Secondary | ICD-10-CM

## 2022-06-29 ENCOUNTER — Ambulatory Visit
Admission: RE | Admit: 2022-06-29 | Discharge: 2022-06-29 | Disposition: A | Discharge status: Home / Self Care | Payer: Managed Care, Other (non HMO) | Source: Ambulatory Visit | Attending: Obstetrics and Gynecology | Admitting: Obstetrics and Gynecology

## 2022-06-29 DIAGNOSIS — Z1231 Encounter for screening mammogram for malignant neoplasm of breast: Secondary | ICD-10-CM

## 2022-07-18 ENCOUNTER — Ambulatory Visit: Payer: Managed Care, Other (non HMO) | Admitting: Internal Medicine

## 2023-05-16 ENCOUNTER — Other Ambulatory Visit: Payer: Self-pay | Admitting: Obstetrics and Gynecology

## 2023-05-16 DIAGNOSIS — Z0000 Encounter for general adult medical examination without abnormal findings: Secondary | ICD-10-CM

## 2023-07-02 ENCOUNTER — Ambulatory Visit
Admission: RE | Admit: 2023-07-02 | Discharge: 2023-07-02 | Disposition: A | Discharge status: Home / Self Care | Source: Ambulatory Visit | Attending: Obstetrics and Gynecology | Admitting: Obstetrics and Gynecology

## 2023-07-02 DIAGNOSIS — Z0000 Encounter for general adult medical examination without abnormal findings: Secondary | ICD-10-CM

## 2024-04-15 ENCOUNTER — Other Ambulatory Visit: Payer: Self-pay | Admitting: Obstetrics and Gynecology

## 2024-04-15 DIAGNOSIS — Z1231 Encounter for screening mammogram for malignant neoplasm of breast: Secondary | ICD-10-CM
